# Patient Record
Sex: Female | Born: 1995 | Race: Black or African American | Hispanic: No | Marital: Single | Smoking: Never smoker
Health system: Southern US, Community
[De-identification: ages and names within clinical notes are randomized; demographics above are authoritative.]

## PROBLEM LIST (undated history)

## (undated) DIAGNOSIS — Z789 Other specified health status: Secondary | ICD-10-CM

## (undated) DIAGNOSIS — J45909 Unspecified asthma, uncomplicated: Secondary | ICD-10-CM

## (undated) DIAGNOSIS — Z6841 Body Mass Index (BMI) 40.0 and over, adult: Secondary | ICD-10-CM

## (undated) DIAGNOSIS — A599 Trichomoniasis, unspecified: Secondary | ICD-10-CM

## (undated) HISTORY — DX: Body Mass Index (BMI) 40.0 and over, adult: Z684

## (undated) HISTORY — DX: Trichomoniasis, unspecified: A59.9

---
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
## 2018-11-04 NOTE — L&D Delivery Note (Signed)
Delivery Note At 8:48 AM a viable female was delivered via  (Presentation: Vertex; ROA).  APGAR: 8, 9; weight see delivery note.   Placenta status: Spontaneous, intact. Cord: 3 with the following complications: none.   Anesthesia: None  Episiotomy: None  Lacerations: 1st degree periurethral Suture Repair: None. Hemostatic. Est. Blood Loss (mL):  50  Mom to postpartum.  Baby to Couplet care / Skin to Skin.  Gerlene Fee, DO Family Medicine Resident, PGY-1 05/21/2019, 9:03 AM

## 2019-03-11 ENCOUNTER — Other Ambulatory Visit (HOSPITAL_COMMUNITY)
Admission: RE | Admit: 2019-03-11 | Discharge: 2019-03-11 | Disposition: A | Discharge status: Home / Self Care | Payer: Medicaid Other | Source: Ambulatory Visit | Attending: Obstetrics and Gynecology | Admitting: Obstetrics and Gynecology

## 2019-03-11 ENCOUNTER — Encounter: Payer: Self-pay | Admitting: Obstetrics and Gynecology

## 2019-03-11 ENCOUNTER — Other Ambulatory Visit: Payer: Self-pay

## 2019-03-11 ENCOUNTER — Ambulatory Visit (INDEPENDENT_AMBULATORY_CARE_PROVIDER_SITE_OTHER): Payer: Medicaid Other | Admitting: Obstetrics and Gynecology

## 2019-03-11 VITALS — BP 109/70 | HR 72 | Ht 62.0 in | Wt 339.4 lb

## 2019-03-11 DIAGNOSIS — O99213 Obesity complicating pregnancy, third trimester: Secondary | ICD-10-CM | POA: Diagnosis not present

## 2019-03-11 DIAGNOSIS — O99210 Obesity complicating pregnancy, unspecified trimester: Secondary | ICD-10-CM

## 2019-03-11 DIAGNOSIS — O0993 Supervision of high risk pregnancy, unspecified, third trimester: Secondary | ICD-10-CM

## 2019-03-11 DIAGNOSIS — Z3A28 28 weeks gestation of pregnancy: Secondary | ICD-10-CM

## 2019-03-11 DIAGNOSIS — O34219 Maternal care for unspecified type scar from previous cesarean delivery: Secondary | ICD-10-CM | POA: Diagnosis not present

## 2019-03-11 DIAGNOSIS — O0990 Supervision of high risk pregnancy, unspecified, unspecified trimester: Secondary | ICD-10-CM

## 2019-03-11 DIAGNOSIS — Z98891 History of uterine scar from previous surgery: Secondary | ICD-10-CM

## 2019-03-11 DIAGNOSIS — O0930 Supervision of pregnancy with insufficient antenatal care, unspecified trimester: Secondary | ICD-10-CM | POA: Insufficient documentation

## 2019-03-11 DIAGNOSIS — Z6841 Body Mass Index (BMI) 40.0 and over, adult: Secondary | ICD-10-CM | POA: Insufficient documentation

## 2019-03-11 NOTE — Progress Notes (Signed)
Last pap 2020- normal  Declined tdap Declined flu
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
## 2019-03-12 LAB — OBSTETRIC PANEL, INCLUDING HIV
Basophils Absolute: 0 10*3/uL (ref 0.0–0.2)
Basos: 0 %
EOS (ABSOLUTE): 0 10*3/uL (ref 0.0–0.4)
Eos: 0 %
HIV Screen 4th Generation wRfx: NONREACTIVE
Hematocrit: 29.4 % — ABNORMAL LOW (ref 34.0–46.6)
Hemoglobin: 9.6 g/dL — ABNORMAL LOW (ref 11.1–15.9)
Hepatitis B Surface Ag: NEGATIVE
Immature Grans (Abs): 0.1 10*3/uL (ref 0.0–0.1)
Immature Granulocytes: 1 %
Lymphocytes Absolute: 2.2 10*3/uL (ref 0.7–3.1)
Lymphs: 19 %
MCH: 24.3 pg — ABNORMAL LOW (ref 26.6–33.0)
MCHC: 32.7 g/dL (ref 31.5–35.7)
MCV: 74 fL — ABNORMAL LOW (ref 79–97)
Monocytes Absolute: 0.5 10*3/uL (ref 0.1–0.9)
Monocytes: 4 %
Neutrophils Absolute: 9 10*3/uL — ABNORMAL HIGH (ref 1.4–7.0)
Neutrophils: 76 %
Platelets: 349 10*3/uL (ref 150–450)
RBC: 3.95 x10E6/uL (ref 3.77–5.28)
RDW: 16 % — ABNORMAL HIGH (ref 11.7–15.4)
RPR Ser Ql: NONREACTIVE
Rh Factor: POSITIVE
Rubella Antibodies, IGG: 10.9 index (ref >0.99)
WBC: 11.9 10*3/uL — ABNORMAL HIGH (ref 3.4–10.8)

## 2019-03-12 LAB — DRUG SCREEN 764883 11+OXYCO+ALC+CRT-BUND
Amphetamines, Urine: NEGATIVE ng/mL
BENZODIAZ UR QL: NEGATIVE ng/mL
Barbiturate: NEGATIVE ng/mL
Cannabinoid Quant, Ur: NEGATIVE ng/mL
Cocaine (Metabolite): NEGATIVE ng/mL
Creatinine: 149.1 mg/dL (ref 20.0–300.0)
Ethanol: NEGATIVE %
Meperidine: NEGATIVE ng/mL
Methadone Screen, Urine: NEGATIVE ng/mL
OPIATE SCREEN URINE: NEGATIVE ng/mL
Oxycodone/Oxymorphone, Urine: NEGATIVE ng/mL
Phencyclidine: NEGATIVE ng/mL
Propoxyphene: NEGATIVE ng/mL
Tramadol: NEGATIVE ng/mL
pH, Urine: 6.5 (ref 4.5–8.9)

## 2019-03-12 LAB — PROTEIN / CREATININE RATIO, URINE
Creatinine, Urine: 148.4 mg/dL
Protein, Ur: 14.5 mg/dL
Protein/Creat Ratio: 98 mg/g creat (ref 0–200)

## 2019-03-12 LAB — AB SCR+ANTIBODY ID: Antibody Screen: POSITIVE — AB

## 2019-03-12 LAB — GLUCOSE TOLERANCE, 1 HOUR: Glucose, 1Hr PP: 126 mg/dL (ref 65–199)

## 2019-03-12 LAB — TSH: TSH: 0.544 u[IU]/mL (ref 0.450–4.500)

## 2019-03-13 LAB — URINE CULTURE, OB REFLEX

## 2019-03-13 LAB — CULTURE, OB URINE

## 2019-03-15 ENCOUNTER — Encounter: Payer: Self-pay | Admitting: Obstetrics and Gynecology

## 2019-03-15 DIAGNOSIS — O361990 Maternal care for other isoimmunization, unspecified trimester, not applicable or unspecified: Secondary | ICD-10-CM | POA: Insufficient documentation

## 2019-03-15 DIAGNOSIS — O23599 Infection of other part of genital tract in pregnancy, unspecified trimester: Secondary | ICD-10-CM

## 2019-03-15 DIAGNOSIS — O99019 Anemia complicating pregnancy, unspecified trimester: Secondary | ICD-10-CM | POA: Insufficient documentation

## 2019-03-15 DIAGNOSIS — A5901 Trichomonal vulvovaginitis: Secondary | ICD-10-CM | POA: Insufficient documentation

## 2019-03-15 LAB — CERVICOVAGINAL ANCILLARY ONLY
Chlamydia: NEGATIVE
Neisseria Gonorrhea: NEGATIVE
Trichomonas: POSITIVE — AB

## 2019-03-15 MED ORDER — PREPLUS 27-1 MG PO TABS: 1.0000 | ORAL_TABLET | Freq: Every day | ORAL | 13 refills | Status: AC

## 2019-03-16 ENCOUNTER — Ambulatory Visit (HOSPITAL_COMMUNITY): Payer: Medicaid Other | Admitting: *Deleted

## 2019-03-16 ENCOUNTER — Telehealth: Payer: Self-pay | Admitting: *Deleted

## 2019-03-16 ENCOUNTER — Encounter (HOSPITAL_COMMUNITY): Payer: Self-pay

## 2019-03-16 ENCOUNTER — Other Ambulatory Visit (HOSPITAL_COMMUNITY): Payer: Self-pay | Admitting: *Deleted

## 2019-03-16 ENCOUNTER — Ambulatory Visit (HOSPITAL_COMMUNITY)
Admission: RE | Admit: 2019-03-16 | Discharge: 2019-03-16 | Disposition: A | Discharge status: Home / Self Care | Payer: Medicaid Other | Source: Ambulatory Visit | Attending: Obstetrics and Gynecology | Admitting: Obstetrics and Gynecology

## 2019-03-16 ENCOUNTER — Other Ambulatory Visit: Payer: Self-pay

## 2019-03-16 VITALS — Temp 98.4°F

## 2019-03-16 DIAGNOSIS — O0933 Supervision of pregnancy with insufficient antenatal care, third trimester: Secondary | ICD-10-CM

## 2019-03-16 DIAGNOSIS — O0990 Supervision of high risk pregnancy, unspecified, unspecified trimester: Secondary | ICD-10-CM

## 2019-03-16 DIAGNOSIS — O99213 Obesity complicating pregnancy, third trimester: Secondary | ICD-10-CM

## 2019-03-16 DIAGNOSIS — Z362 Encounter for other antenatal screening follow-up: Secondary | ICD-10-CM

## 2019-03-16 DIAGNOSIS — Z363 Encounter for antenatal screening for malformations: Secondary | ICD-10-CM

## 2019-03-16 DIAGNOSIS — O34219 Maternal care for unspecified type scar from previous cesarean delivery: Secondary | ICD-10-CM | POA: Diagnosis not present

## 2019-03-16 DIAGNOSIS — O99013 Anemia complicating pregnancy, third trimester: Secondary | ICD-10-CM

## 2019-03-16 DIAGNOSIS — Z3A29 29 weeks gestation of pregnancy: Secondary | ICD-10-CM

## 2019-03-16 LAB — COMPREHENSIVE METABOLIC PANEL
ALT: 18 IU/L (ref 0–32)
AST: 12 IU/L (ref 0–40)
Albumin/Globulin Ratio: 1.1 — ABNORMAL LOW (ref 1.2–2.2)
Albumin: 3.3 g/dL — ABNORMAL LOW (ref 3.9–5.0)
Alkaline Phosphatase: 91 IU/L (ref 39–117)
BUN/Creatinine Ratio: 13 (ref 9–23)
BUN: 7 mg/dL (ref 6–20)
Bilirubin Total: <0.2 mg/dL (ref 0.0–1.2)
CO2: 19 mmol/L — ABNORMAL LOW (ref 20–29)
Calcium: 9.2 mg/dL (ref 8.7–10.2)
Chloride: 99 mmol/L (ref 96–106)
Creatinine, Ser: 0.55 mg/dL — ABNORMAL LOW (ref 0.57–1.00)
GFR calc Af Amer: 153 mL/min/{1.73_m2} (ref >59)
GFR calc non Af Amer: 133 mL/min/{1.73_m2} (ref >59)
Globulin, Total: 3 g/dL (ref 1.5–4.5)
Glucose: 143 mg/dL — ABNORMAL HIGH (ref 65–99)
Potassium: 4.1 mmol/L (ref 3.5–5.2)
Sodium: 134 mmol/L (ref 134–144)
Total Protein: 6.3 g/dL (ref 6.0–8.5)

## 2019-03-16 LAB — SPECIMEN STATUS REPORT

## 2019-03-16 MED ORDER — METRONIDAZOLE 500 MG PO TABS: ORAL_TABLET | ORAL | 1 refills | Status: DC

## 2019-03-16 NOTE — Telephone Encounter (Signed)
Pt informed of results and positive for trichomonas. Rx sent into to pharmacy and explained to that the partner also needs to be treated. Instructed to no have intercourse until at least 10 days after treatment. Pt verbalized and understands.

## 2019-03-17 ENCOUNTER — Encounter: Payer: Self-pay | Admitting: Obstetrics and Gynecology

## 2019-03-17 DIAGNOSIS — O0990 Supervision of high risk pregnancy, unspecified, unspecified trimester: Secondary | ICD-10-CM

## 2019-03-22 ENCOUNTER — Telehealth: Payer: Self-pay | Admitting: Radiology

## 2019-03-22 ENCOUNTER — Encounter: Payer: Self-pay | Admitting: Radiology

## 2019-03-22 NOTE — Telephone Encounter (Signed)
Patient was informed of [facility] lab results

## 2019-03-23 ENCOUNTER — Encounter: Payer: Self-pay | Admitting: Obstetrics and Gynecology

## 2019-03-23 ENCOUNTER — Encounter: Payer: Self-pay | Admitting: *Deleted

## 2019-03-23 ENCOUNTER — Other Ambulatory Visit: Payer: Self-pay

## 2019-03-23 ENCOUNTER — Ambulatory Visit (INDEPENDENT_AMBULATORY_CARE_PROVIDER_SITE_OTHER): Payer: Medicaid Other | Admitting: Obstetrics and Gynecology

## 2019-03-23 VITALS — BP 121/76 | HR 86 | Wt 345.0 lb

## 2019-03-23 DIAGNOSIS — A5901 Trichomonal vulvovaginitis: Secondary | ICD-10-CM

## 2019-03-23 DIAGNOSIS — O0990 Supervision of high risk pregnancy, unspecified, unspecified trimester: Secondary | ICD-10-CM

## 2019-03-23 DIAGNOSIS — O23593 Infection of other part of genital tract in pregnancy, third trimester: Secondary | ICD-10-CM

## 2019-03-23 DIAGNOSIS — Z98891 History of uterine scar from previous surgery: Secondary | ICD-10-CM

## 2019-03-23 DIAGNOSIS — D563 Thalassemia minor: Secondary | ICD-10-CM | POA: Insufficient documentation

## 2019-03-23 DIAGNOSIS — Z3A30 30 weeks gestation of pregnancy: Secondary | ICD-10-CM

## 2019-03-23 DIAGNOSIS — O99013 Anemia complicating pregnancy, third trimester: Secondary | ICD-10-CM

## 2019-03-23 DIAGNOSIS — O361930 Maternal care for other isoimmunization, third trimester, not applicable or unspecified: Secondary | ICD-10-CM

## 2019-03-23 DIAGNOSIS — O361910 Maternal care for other isoimmunization, first trimester, not applicable or unspecified: Secondary | ICD-10-CM

## 2019-03-23 MED ORDER — FERROUS GLUCONATE 324 (38 FE) MG PO TABS: 324.0000 mg | ORAL_TABLET | Freq: Every day | ORAL | 2 refills | Status: DC

## 2019-03-23 NOTE — Progress Notes (Signed)
Prenatal Visit Note Date: 03/23/2019 Clinic: [facility]  Subjective:  Kellie Boyd is a 23 y.o. [id number] at [redacted]w[redacted]d being seen today for ongoing prenatal care.  She is currently monitored for the following issues for this high-risk pregnancy and has Late prenatal care; Obesity in pregnancy; Supervision of high risk pregnancy, antepartum; BMI 60.0-69.9, adult (HCC); History of cesarean delivery affecting pregnancy; History of VBAC; Lewis isoimmunization during pregnancy; Anemia in pregnancy; Trichomonal vaginitis during pregnancy; and Alpha thalassemia silent carrier on their problem list.  Patient reports no complaints.   Contractions: Not present. Vag. Bleeding: None.  Movement: Present. Denies leaking of fluid.   The following portions of the patient's history were reviewed and updated as appropriate: allergies, current medications, past family history, past medical history, past social history, past surgical history and problem list. Problem list updated.  Objective:   Vitals:   03/23/19 1025  BP: 121/76  Pulse: 86  Weight: (!) 345 lb (156.5 kg)    Fetal Status: Fetal Heart Rate (bpm): 138   Movement: Present     General:  Alert, oriented and cooperative. Patient is in no acute distress.  Skin: Skin is warm and dry. No rash noted.   Cardiovascular: Normal heart rate noted  Respiratory: Normal respiratory effort, no problems with respiration noted  Abdomen: Soft, gravid, appropriate for gestational age. Pain/Pressure: Present     Pelvic:  Cervical exam deferred        Extremities: Normal range of motion.     Mental Status: Normal mood and affect. Normal behavior. Normal judgment and thought content.   Urinalysis:      Assessment and Plan:  Pregnancy: [id number] at [redacted]w[redacted]d  1. Trichomonal vaginitis during pregnancy in third trimester toc one month  2. Supervision of high risk pregnancy, antepartum btl papers today. D/w her re: btl. D/w her re: weight  3.  Lewis isoimmunization during pregnancy in first trimester, single or unspecified fetus No issues  4. Anemia during pregnancy in third trimester Alpha thal silent carrier (d/w pt)  5. History of VBAC Will set up rpt at edc but it goes into labor before then will try for VD. Form signed today  Preterm labor symptoms and general obstetric precautions including but not limited to vaginal bleeding, contractions, leaking of fluid and fetal movement were reviewed in detail with the patient. Please refer to After Visit Summary for other counseling recommendations.  Return in about 2 weeks (around 04/06/2019) for rob in person.   Thatcher Bing, MD

## 2019-03-31 ENCOUNTER — Encounter: Payer: Self-pay | Admitting: Radiology

## 2019-03-31 ENCOUNTER — Encounter: Payer: Self-pay | Admitting: *Deleted

## 2019-04-06 ENCOUNTER — Ambulatory Visit (INDEPENDENT_AMBULATORY_CARE_PROVIDER_SITE_OTHER): Payer: Medicaid Other | Admitting: Obstetrics and Gynecology

## 2019-04-06 ENCOUNTER — Other Ambulatory Visit: Payer: Self-pay

## 2019-04-06 ENCOUNTER — Other Ambulatory Visit (HOSPITAL_COMMUNITY)
Admission: RE | Admit: 2019-04-06 | Discharge: 2019-04-06 | Disposition: A | Discharge status: Home / Self Care | Payer: Medicaid Other | Source: Ambulatory Visit | Attending: Obstetrics and Gynecology | Admitting: Obstetrics and Gynecology

## 2019-04-06 VITALS — BP 133/75 | HR 72 | Wt 350.0 lb

## 2019-04-06 DIAGNOSIS — Z98891 History of uterine scar from previous surgery: Secondary | ICD-10-CM

## 2019-04-06 DIAGNOSIS — Z6841 Body Mass Index (BMI) 40.0 and over, adult: Secondary | ICD-10-CM

## 2019-04-06 DIAGNOSIS — O23593 Infection of other part of genital tract in pregnancy, third trimester: Secondary | ICD-10-CM

## 2019-04-06 DIAGNOSIS — A5901 Trichomonal vulvovaginitis: Secondary | ICD-10-CM | POA: Diagnosis present

## 2019-04-06 DIAGNOSIS — Z3A32 32 weeks gestation of pregnancy: Secondary | ICD-10-CM

## 2019-04-06 MED ORDER — PANTOPRAZOLE SODIUM 20 MG PO TBEC: 20.0000 mg | DELAYED_RELEASE_TABLET | Freq: Every day | ORAL | 3 refills | Status: DC

## 2019-04-06 NOTE — Progress Notes (Signed)
Need something for heart burn  TOC

## 2019-04-06 NOTE — Progress Notes (Signed)
Prenatal Visit Note Date: 04/06/2019 [facility]  Subjective:  Kellie Boyd is a 23 y.o. [id number] at [redacted]w[redacted]d being seen today for ongoing prenatal care.  She is currently monitored for the following issues for this high-risk pregnancy and has Late prenatal care; Obesity in pregnancy; Supervision of high risk pregnancy, antepartum; BMI 60.0-69.9, adult (HCC); History of cesarean delivery affecting pregnancy; History of VBAC; Lewis isoimmunization during pregnancy; Anemia in pregnancy; Trichomonal vaginitis during pregnancy; and Alpha thalassemia silent carrier on their problem list.  Patient reports no complaints.   Contractions: Not present. Vag. Bleeding: None.  Movement: Present. Denies leaking of fluid.   The following portions of the patient's history were reviewed and updated as appropriate: allergies, current medications, past family history, past medical history, past social history, past surgical history and problem list. Problem list updated.  Objective:   Vitals:   04/06/19 1018  BP: 133/75  Pulse: 72  Weight: (!) 350 lb (158.8 kg)    Fetal Status: Fetal Heart Rate (bpm): 134 Fundal Height: 33 cm Movement: Present     General:  Alert, oriented and cooperative. Patient is in no acute distress.  Skin: Skin is warm and dry. No rash noted.   Cardiovascular: Normal heart rate noted  Respiratory: Normal respiratory effort, no problems with respiration noted  Abdomen: Soft, gravid, appropriate for gestational age. Pain/Pressure: Present     Pelvic:  Cervical exam deferred        Extremities: Normal range of motion.     Mental Status: Normal mood and affect. Normal behavior. Normal judgment and thought content.   Urinalysis:      Assessment and Plan:  Pregnancy: [id number] at [redacted]w[redacted]d  1. Trichomonal vaginitis during pregnancy in third trimester toc today - Cervicovaginal ancillary only( [place])  2. History of VBAC C/s set up if doesn't go into  labor. tolac consent signed  3. BMI 60.0-69.9, adult (HCC)  Preterm labor symptoms and general obstetric precautions including but not limited to vaginal bleeding, contractions, leaking of fluid and fetal movement were reviewed in detail with the patient. Please refer to After Visit Summary for other counseling recommendations.  Return in about 2 weeks (around 04/20/2019) for rob in person.    Bing, MD

## 2019-04-07 LAB — CERVICOVAGINAL ANCILLARY ONLY
Chlamydia: NEGATIVE
Neisseria Gonorrhea: NEGATIVE
Trichomonas: POSITIVE — AB

## 2019-04-09 MED ORDER — METRONIDAZOLE 500 MG PO TABS: 500.0000 mg | ORAL_TABLET | Freq: Two times a day (BID) | ORAL | 0 refills | Status: DC

## 2019-04-09 NOTE — Addendum Note (Signed)
Addended by: Sublette Bing on: 04/09/2019 02:04 PM   Modules accepted: Orders

## 2019-04-12 ENCOUNTER — Other Ambulatory Visit: Payer: Self-pay | Admitting: *Deleted

## 2019-04-12 MED ORDER — METRONIDAZOLE 500 MG PO TABS: 500.0000 mg | ORAL_TABLET | Freq: Two times a day (BID) | ORAL | 0 refills | Status: AC

## 2019-04-15 ENCOUNTER — Ambulatory Visit (HOSPITAL_COMMUNITY): Payer: Medicaid Other | Admitting: *Deleted

## 2019-04-15 ENCOUNTER — Other Ambulatory Visit: Payer: Self-pay

## 2019-04-15 ENCOUNTER — Encounter (HOSPITAL_COMMUNITY): Payer: Self-pay

## 2019-04-15 ENCOUNTER — Ambulatory Visit (HOSPITAL_COMMUNITY)
Admission: RE | Admit: 2019-04-15 | Discharge: 2019-04-15 | Disposition: A | Discharge status: Home / Self Care | Payer: Medicaid Other | Source: Ambulatory Visit | Attending: Obstetrics and Gynecology | Admitting: Obstetrics and Gynecology

## 2019-04-15 ENCOUNTER — Other Ambulatory Visit (HOSPITAL_COMMUNITY): Payer: Self-pay | Admitting: *Deleted

## 2019-04-15 VITALS — BP 114/61 | HR 93 | Temp 98.6°F

## 2019-04-15 DIAGNOSIS — O34219 Maternal care for unspecified type scar from previous cesarean delivery: Secondary | ICD-10-CM

## 2019-04-15 DIAGNOSIS — O99013 Anemia complicating pregnancy, third trimester: Secondary | ICD-10-CM

## 2019-04-15 DIAGNOSIS — Z362 Encounter for other antenatal screening follow-up: Secondary | ICD-10-CM | POA: Insufficient documentation

## 2019-04-15 DIAGNOSIS — O99213 Obesity complicating pregnancy, third trimester: Secondary | ICD-10-CM

## 2019-04-15 DIAGNOSIS — O0990 Supervision of high risk pregnancy, unspecified, unspecified trimester: Secondary | ICD-10-CM | POA: Diagnosis present

## 2019-04-15 DIAGNOSIS — Z3689 Encounter for other specified antenatal screening: Secondary | ICD-10-CM

## 2019-04-15 DIAGNOSIS — O0933 Supervision of pregnancy with insufficient antenatal care, third trimester: Secondary | ICD-10-CM

## 2019-04-15 DIAGNOSIS — Z3A33 33 weeks gestation of pregnancy: Secondary | ICD-10-CM

## 2019-04-20 ENCOUNTER — Other Ambulatory Visit: Payer: Self-pay

## 2019-04-20 ENCOUNTER — Ambulatory Visit (INDEPENDENT_AMBULATORY_CARE_PROVIDER_SITE_OTHER): Payer: Medicaid Other | Admitting: Family Medicine

## 2019-04-20 VITALS — BP 127/74 | HR 80 | Wt 347.0 lb

## 2019-04-20 DIAGNOSIS — O23593 Infection of other part of genital tract in pregnancy, third trimester: Secondary | ICD-10-CM

## 2019-04-20 DIAGNOSIS — A5901 Trichomonal vulvovaginitis: Secondary | ICD-10-CM

## 2019-04-20 DIAGNOSIS — O0990 Supervision of high risk pregnancy, unspecified, unspecified trimester: Secondary | ICD-10-CM

## 2019-04-20 DIAGNOSIS — Z3A34 34 weeks gestation of pregnancy: Secondary | ICD-10-CM

## 2019-04-20 DIAGNOSIS — Z98891 History of uterine scar from previous surgery: Secondary | ICD-10-CM

## 2019-04-20 MED ORDER — TINIDAZOLE 500 MG PO TABS: 2.0000 g | ORAL_TABLET | Freq: Once | ORAL | 1 refills | Status: AC

## 2019-04-20 NOTE — Progress Notes (Signed)
   PRENATAL VISIT NOTE  Subjective:  Kellie Boyd is a 23 y.o. [phone] at [redacted]w[redacted]d being seen today for ongoing prenatal care.  She is currently monitored for the following issues for this high-risk pregnancy and has Late prenatal care; Obesity in pregnancy; Supervision of high risk pregnancy, antepartum; BMI 60.0-69.9, adult ([place]); History of cesarean delivery affecting pregnancy; History of VBAC; Lewis isoimmunization during pregnancy; Anemia in pregnancy; Trichomonal vaginitis during pregnancy; and Alpha thalassemia silent carrier on their problem list.  Patient reports no complaints.  Contractions: Not present. Vag. Bleeding: None.  Movement: Present. Denies leaking of fluid.   The following portions of the patient's history were reviewed and updated as appropriate: allergies, current medications, past family history, past medical history, past social history, past surgical history and problem list.   Objective:   Vitals:   04/20/19 1125  BP: 127/74  Pulse: 80  Weight: (!) 347 lb (157.4 kg)    Fetal Status: Fetal Heart Rate (bpm): 138   Movement: Present     General:  Alert, oriented and cooperative. Patient is in no acute distress.  Skin: Skin is warm and dry. No rash noted.   Cardiovascular: Normal heart rate noted  Respiratory: Normal respiratory effort, no problems with respiration noted  Abdomen: Soft, gravid, appropriate for gestational age.  Pain/Pressure: Absent     Pelvic: Cervical exam deferred        Extremities: Normal range of motion.  Edema: None  Mental Status: Normal mood and affect. Normal behavior. Normal judgment and thought content.   Assessment and Plan:  Pregnancy: [id number] at [redacted]w[redacted]d 1. Trichomonal vaginitis during pregnancy in third trimester Re-treat with Tindamax  2. Supervision of high risk pregnancy, antepartum For 36 wk cultures at next visit  3. History of VBAC For VBAC is labors, o/w RCS--desires BTL and papers are signed, and she might want them  untied in 5 years. Discussed not doing BTL if unsure about future fertility.  Preterm labor symptoms and general obstetric precautions including but not limited to vaginal bleeding, contractions, leaking of fluid and fetal movement were reviewed in detail with the patient. Please refer to After Visit Summary for other counseling recommendations.   Return in 2 weeks (on 05/04/2019).  Future Appointments  Date Time Provider [place]  05/04/2019  1:45 PM Donnamae Jude, MD [facility]  05/13/2019  9:30 AM [facility] NURSE [place] [facility]  05/13/2019  9:30 AM [facility] [place] 1 [facility]    Donnamae Jude, MD

## 2019-04-20 NOTE — Progress Notes (Signed)
Needs RX for flagyl for trich

## 2019-04-20 NOTE — Patient Instructions (Signed)

## 2019-05-04 ENCOUNTER — Ambulatory Visit (INDEPENDENT_AMBULATORY_CARE_PROVIDER_SITE_OTHER): Payer: Medicaid Other | Admitting: Family Medicine

## 2019-05-04 ENCOUNTER — Other Ambulatory Visit (HOSPITAL_COMMUNITY)
Admission: RE | Admit: 2019-05-04 | Discharge: 2019-05-04 | Disposition: A | Discharge status: Home / Self Care | Payer: Medicaid Other | Source: Ambulatory Visit | Attending: Family Medicine | Admitting: Family Medicine

## 2019-05-04 ENCOUNTER — Other Ambulatory Visit: Payer: Self-pay

## 2019-05-04 VITALS — BP 132/76 | HR 93 | Wt 350.0 lb

## 2019-05-04 DIAGNOSIS — O0990 Supervision of high risk pregnancy, unspecified, unspecified trimester: Secondary | ICD-10-CM | POA: Diagnosis not present

## 2019-05-04 DIAGNOSIS — O34219 Maternal care for unspecified type scar from previous cesarean delivery: Secondary | ICD-10-CM

## 2019-05-04 DIAGNOSIS — Z98891 History of uterine scar from previous surgery: Secondary | ICD-10-CM

## 2019-05-04 DIAGNOSIS — Z3A36 36 weeks gestation of pregnancy: Secondary | ICD-10-CM

## 2019-05-04 DIAGNOSIS — O0993 Supervision of high risk pregnancy, unspecified, third trimester: Secondary | ICD-10-CM

## 2019-05-04 NOTE — Progress Notes (Signed)
   PRENATAL VISIT NOTE  Subjective:  Kellie Boyd is a 23 y.o. [phone] at [redacted]w[redacted]d being seen today for ongoing prenatal care.  She is currently monitored for the following issues for this low-risk pregnancy and has Late prenatal care; Obesity in pregnancy; Supervision of high risk pregnancy, antepartum; BMI 60.0-69.9, adult ([place]); History of cesarean delivery affecting pregnancy; History of VBAC; Lewis isoimmunization during pregnancy; Anemia in pregnancy; Trichomonal vaginitis during pregnancy; and Alpha thalassemia silent carrier on their problem list.  Patient reports no complaints.  Contractions: Irritability. Vag. Bleeding: None.  Movement: Present. Denies leaking of fluid.   The following portions of the patient's history were reviewed and updated as appropriate: allergies, current medications, past family history, past medical history, past social history, past surgical history and problem list.   Objective:   Vitals:   05/04/19 1341  BP: 132/76  Pulse: 93  Weight: (!) 350 lb (158.8 kg)    Fetal Status: Fetal Heart Rate (bpm): 150 Fundal Height: 44 cm Movement: Present  Presentation: Vertex  General:  Alert, oriented and cooperative. Patient is in no acute distress.  Skin: Skin is warm and dry. No rash noted.   Cardiovascular: Normal heart rate noted  Respiratory: Normal respiratory effort, no problems with respiration noted  Abdomen: Soft, gravid, appropriate for gestational age.  Pain/Pressure: Present     Pelvic: Cervical exam performed Dilation: 1 Effacement (%): 40 Station: -2  Extremities: Normal range of motion.  Edema: None  Mental Status: Normal mood and affect. Normal behavior. Normal judgment and thought content.   Assessment and Plan:  Pregnancy: [id number] at [redacted]w[redacted]d 1. Supervision of high risk pregnancy, antepartum Cultures today - Strep Gp B NAA - GC/Chlamydia probe amp ([place])not at [facility]  2. History of cesarean delivery affecting pregnancy For RCS if does  not go into labor  3. History of VBAC For VBAC if labors  Preterm labor symptoms and general obstetric precautions including but not limited to vaginal bleeding, contractions, leaking of fluid and fetal movement were reviewed in detail with the patient. Please refer to After Visit Summary for other counseling recommendations.   Return in 1 week (on 05/11/2019) for in person.  Future Appointments  Date Time Provider [place]  05/11/2019 10:45 AM Osborne Oman, MD [facility]  05/13/2019  9:30 AM [facility] NURSE [place] [facility]  05/13/2019  9:30 AM [facility] [place] 1 [facility]    Donnamae Jude, MD

## 2019-05-04 NOTE — Patient Instructions (Addendum)
Breastfeeding  Choosing to breastfeed is one of the best decisions you can make for yourself and your baby. A change in hormones during pregnancy causes your breasts to make breast milk in your milk-producing glands. Hormones prevent breast milk from being released before your baby is born. They also prompt milk flow after birth. Once breastfeeding has begun, thoughts of your baby, as well as his or her sucking or crying, can stimulate the release of milk from your milk-producing glands. Benefits of breastfeeding Research shows that breastfeeding offers many health benefits for infants and mothers. It also offers a cost-free and convenient way to feed your baby. For your baby  Your first milk (colostrum) helps your baby's digestive system to function better.  Special cells in your milk (antibodies) help your baby to fight off infections.  Breastfed babies are less likely to develop asthma, allergies, obesity, or type 2 diabetes. They are also at lower risk for sudden infant death syndrome (SIDS).  Nutrients in breast milk are better able to meet your babys needs compared to infant formula.  Breast milk improves your baby's brain development. For you  Breastfeeding helps to create a very special bond between you and your baby.  Breastfeeding is convenient. Breast milk costs nothing and is always available at the correct temperature.  Breastfeeding helps to burn calories. It helps you to lose the weight that you gained during pregnancy.  Breastfeeding makes your uterus return faster to its size before pregnancy. It also slows bleeding (lochia) after you give birth.  Breastfeeding helps to lower your risk of developing type 2 diabetes, osteoporosis, rheumatoid arthritis, cardiovascular disease, and breast, ovarian, uterine, and endometrial cancer later in life. Breastfeeding basics Starting breastfeeding  Find a comfortable place to sit or lie down, with your neck and back  well-supported.  Place a pillow or a rolled-up blanket under your baby to bring him or her to the level of your breast (if you are seated). Nursing pillows are specially designed to help support your arms and your baby while you breastfeed.  Make sure that your baby's tummy (abdomen) is facing your abdomen.  Gently massage your breast. With your fingertips, massage from the outer edges of your breast inward toward the nipple. This encourages milk flow. If your milk flows slowly, you may need to continue this action during the feeding.  Support your breast with 4 fingers underneath and your thumb above your nipple (make the letter "C" with your hand). Make sure your fingers are well away from your nipple and your babys mouth.  Stroke your baby's lips gently with your finger or nipple.  When your baby's mouth is open wide enough, quickly bring your baby to your breast, placing your entire nipple and as much of the areola as possible into your baby's mouth. The areola is the colored area around your nipple. ? More areola should be visible above your baby's upper lip than below the lower lip. ? Your baby's lips should be opened and extended outward (flanged) to ensure an adequate, comfortable latch. ? Your baby's tongue should be between his or her lower gum and your breast.  Make sure that your baby's mouth is correctly positioned around your nipple (latched). Your baby's lips should create a seal on your breast and be turned out (everted).  It is common for your baby to suck about 2-3 minutes in order to start the flow of breast milk. Latching Teaching your baby how to latch onto your breast  properly is very important. An improper latch can cause nipple pain, decreased milk supply, and poor weight gain in your baby. Also, if your baby is not latched onto your nipple properly, he or she may swallow some air during feeding. This can make your baby fussy. Burping your baby when you switch breasts  during the feeding can help to get rid of the air. However, teaching your baby to latch on properly is still the best way to prevent fussiness from swallowing air while breastfeeding. Signs that your baby has successfully latched onto your nipple  Silent tugging or silent sucking, without causing you pain. Infant's lips should be extended outward (flanged).  Swallowing heard between every 3-4 sucks once your milk has started to flow (after your let-down milk reflex occurs).  Muscle movement above and in front of his or her ears while sucking. Signs that your baby has not successfully latched onto your nipple  Sucking sounds or smacking sounds from your baby while breastfeeding.  Nipple pain. If you think your baby has not latched on correctly, slip your finger into the corner of your babys mouth to break the suction and place it between your baby's gums. Attempt to start breastfeeding again. Signs of successful breastfeeding Signs from your baby  Your baby will gradually decrease the number of sucks or will completely stop sucking.  Your baby will fall asleep.  Your baby's body will relax.  Your baby will retain a small amount of milk in his or her mouth.  Your baby will let go of your breast by himself or herself. Signs from you  Breasts that have increased in firmness, weight, and size 1-3 hours after feeding.  Breasts that are softer immediately after breastfeeding.  Increased milk volume, as well as a change in milk consistency and color by the fifth day of breastfeeding.  Nipples that are not sore, cracked, or bleeding. Signs that your baby is getting enough milk  Wetting at least 1-2 diapers during the first 24 hours after birth.  Wetting at least 5-6 diapers every 24 hours for the first week after birth. The urine should be clear or pale yellow by the age of 5 days.  Wetting 6-8 diapers every 24 hours as your baby continues to grow and develop.  At least 3 stools in  a 24-hour period by the age of 5 days. The stool should be soft and yellow.  At least 3 stools in a 24-hour period by the age of 7 days. The stool should be seedy and yellow.  No loss of weight greater than 10% of birth weight during the first 3 days of life.  Average weight gain of 4-7 oz (113-198 g) per week after the age of 4 days.  Consistent daily weight gain by the age of 5 days, without weight loss after the age of 2 weeks. After a feeding, your baby may spit up a small amount of milk. This is normal. Breastfeeding frequency and duration Frequent feeding will help you make more milk and can prevent sore nipples and extremely full breasts (breast engorgement). Breastfeed when you feel the need to reduce the fullness of your breasts or when your baby shows signs of hunger. This is called "breastfeeding on demand." Signs that your baby is hungry include:  Increased alertness, activity, or restlessness.  Movement of the head from side to side.  Opening of the mouth when the corner of the mouth or cheek is stroked (rooting).  Increased sucking sounds, smacking  lips, cooing, sighing, or squeaking.  Hand-to-mouth movements and sucking on fingers or hands.  Fussing or crying. Avoid introducing a pacifier to your baby in the first 4-6 weeks after your baby is born. After this time, you may choose to use a pacifier. Research has shown that pacifier use during the first year of a baby's life decreases the risk of sudden infant death syndrome (SIDS). Allow your baby to feed on each breast as long as he or she wants. When your baby unlatches or falls asleep while feeding from the first breast, offer the second breast. Because newborns are often sleepy in the first few weeks of life, you may need to awaken your baby to get him or her to feed. Breastfeeding times will vary from baby to baby. However, the following rules can serve as a guide to help you make sure that your baby is properly  fed:  Newborns (babies 74 weeks of age or younger) may breastfeed every 1-3 hours.  Newborns should not go without breastfeeding for longer than 3 hours during the day or 5 hours during the night.  You should breastfeed your baby a minimum of 8 times in a 24-hour period. Breast milk pumping     Pumping and storing breast milk allows you to make sure that your baby is exclusively fed your breast milk, even at times when you are unable to breastfeed. This is especially important if you go back to work while you are still breastfeeding, or if you are not able to be present during feedings. Your lactation consultant can help you find a method of pumping that works best for you and give you guidelines about how long it is safe to store breast milk. Caring for your breasts while you breastfeed Nipples can become dry, cracked, and sore while breastfeeding. The following recommendations can help keep your breasts moisturized and healthy:  Avoid using soap on your nipples.  Wear a supportive bra designed especially for nursing. Avoid wearing underwire-style bras or extremely tight bras (sports bras).  Air-dry your nipples for 3-4 minutes after each feeding.  Use only cotton bra pads to absorb leaked breast milk. Leaking of breast milk between feedings is normal.  Use lanolin on your nipples after breastfeeding. Lanolin helps to maintain your skin's normal moisture barrier. Pure lanolin is not harmful (not toxic) to your baby. You may also hand express a few drops of breast milk and gently massage that milk into your nipples and allow the milk to air-dry. In the first few weeks after giving birth, some women experience breast engorgement. Engorgement can make your breasts feel heavy, warm, and tender to the touch. Engorgement peaks within 3-5 days after you give birth. The following recommendations can help to ease engorgement:  Completely empty your breasts while breastfeeding or pumping. You may  want to start by applying warm, moist heat (in the shower or with warm, water-soaked hand towels) just before feeding or pumping. This increases circulation and helps the milk flow. If your baby does not completely empty your breasts while breastfeeding, pump any extra milk after he or she is finished.  Apply ice packs to your breasts immediately after breastfeeding or pumping, unless this is too uncomfortable for you. To do this: ? Put ice in a plastic bag. ? Place a towel between your skin and the bag. ? Leave the ice on for 20 minutes, 2-3 times a day.  Make sure that your baby is latched on and positioned properly while  If engorgement persists after 48 hours of following these recommendations, contact your health care provider or a Advertising copywriter. Overall health care recommendations while breastfeeding  Eat 3 healthy meals and 3 snacks every day. Well-nourished mothers who are breastfeeding need an additional 450-500 calories a day. You can meet this requirement by increasing the amount of a balanced diet that you eat.  Drink enough water to keep your urine pale yellow or clear.  Rest often, relax, and continue to take your prenatal vitamins to prevent fatigue, stress, and low vitamin and mineral levels in your body (nutrient deficiencies).  Do not use any products that contain nicotine or tobacco, such as cigarettes and e-cigarettes. Your baby may be harmed by chemicals from cigarettes that pass into breast milk and exposure to secondhand smoke. If you need help quitting, ask your health care provider.  Avoid alcohol.  Do not use illegal drugs or marijuana.  Talk with your health care provider before taking any medicines. These include over-the-counter and prescription medicines as well as vitamins and herbal supplements. Some medicines that may be harmful to your baby can pass through breast milk.  It is possible to become pregnant while breastfeeding. If birth  control is desired, ask your health care provider about options that will be safe while breastfeeding your baby. Where to find more information: [employer]: www.llli.org Contact a health care provider if:  You feel like you want to stop breastfeeding or have become frustrated with breastfeeding.  Your nipples are cracked or bleeding.  Your breasts are red, tender, or warm.  You have: ? Painful breasts or nipples. ? A swollen area on either breast. ? A fever or chills. ? Nausea or vomiting. ? Drainage other than breast milk from your nipples.  Your breasts do not become full before feedings by the fifth day after you give birth.  You feel sad and depressed.  Your baby is: ? Too sleepy to eat well. ? Having trouble sleeping. ? More than 65 week old and wetting fewer than 6 diapers in a 24-hour period. ? Not gaining weight by 28 days of age.  Your baby has fewer than 3 stools in a 24-hour period.  Your baby's skin or the white parts of his or her eyes become yellow. Get help right away if:  Your baby is overly tired (lethargic) and does not want to wake up and feed.  Your baby develops an unexplained fever. Summary  Breastfeeding offers many health benefits for infant and mothers.  Try to breastfeed your infant when he or she shows early signs of hunger.  Gently tickle or stroke your baby's lips with your finger or nipple to allow the baby to open his or her mouth. Bring the baby to your breast. Make sure that much of the areola is in your baby's mouth. Offer one side and burp the baby before you offer the other side.  Talk with your health care provider or lactation consultant if you have questions or you face problems as you breastfeed. This information is not intended to replace advice given to you by your health care provider. Make sure you discuss any questions you have with your health care provider. Document Released: 10/21/2005 Document Revised:  01/15/2018 Document Reviewed: 11/22/2016 Elsevier Patient Education  2020 [employer].  Places to have your son circumcised:  [facility]     [phone]   $480 while you are in hospital         [facility]              [phone]   $269 by 4 wks                      [facility]                     [phone]   $269 by 7 days [facility]                    [phone]   $269 by 4 wks [facility]             [phone]   $175 by 2 wks    These prices sometimes change but are roughly what you can expect to pay. Please call and confirm pricing.   Circumcision is considered an elective/non-medically necessary procedure. There are many reasons parents decide to have their sons circumsized. During the first year of life circumcised males have a reduced risk of urinary tract infections but after this year the rates between circumcised males and uncircumcised males are the same.  It is safe to have your son circumcised outside of the hospital and the places above perform them regularly.    AREA PEDIATRIC/FAMILY PRACTICE PHYSICIANS  [place] ([phone])  [facility] Melodie Bouillono Chambliss, MD; Lum BabeEniola, MD; [place] SliderHale, MD; Leveda AnnaHensel, MD; McDiarmid, MD; Jerene BearsMcIntyer, MD; Jennette KettleNeal, MD; Gwendolyn GrantWalden, MD o [address] [place], [place] [address] o [phone] o Mon-Fri 8:30-12:30, 1:30-5:00 o Providers come to see babies at [facility] o Accepting [facility]  [facility] [place] o Limited providers who accept newborns: Docia ChuckKoirala, MD; Kateri PlummerMorrow, MD; Paulino RilyWolters, MD o [address], [place], [place] [address] o [phone] o Mon-Fri 8:00-5:30 o Babies seen by providers at [facility] o Does NOT accept Medicaid o Please call early in hospitalization for appointment (limited availability)   [facility] Fatima Sangero Mulberry, MD o [address] [place], [place] [address] o [phone] o Mon, Tue,  Thur, Fri 8:30-5:00, Wed 10:00-7:00 (closed 1-2pm) o Babies seen by [facility] providers o Accepting Medicaid  Donnie Coffinubin - Pediatrician Fae Pippino Rubin, MD o [address], [place], [place] [address] o [phone] o Mon-Fri 8:30-5:00, Sat 8:30-12:00 o Provider comes to see babies at [facility] o Accepting Medicaid o Must have been referred from current patients or contacted office prior to delivery  [facility] Leotis Paino Brown, MD; Ave Filterhandler, MD; Luna FuseEttefagh, MD; Kennedy BuckerGrant, MD; Konrad DoloresLester, MD; Kathlene NovemberMcCormick, MD; Jenne CampusMcQueen, MD; Lubertha SouthProse, MD; Wynetta EmerySimha, MD; Duffy RhodyStanley, MD; Gerre CouchStryffeler, NP; Shirl Harrisebben, NP o [address], [place], [place] [address] o [phone] o Mon, Tue, Thur, Fri 8:30-5:30, Wed 9:30-5:30, Sat 8:30-12:30 o Babies seen by [facility] providers o Accepting Medicaid o Only accepting infants of first-time parents or siblings of current patients o Hospital discharge coordinator will make follow-up appointment  Cyril MourningJack Amos o [address], [place], [place]  [address] o [phone]   Fax - [phone]  [facility] o [address], [place], [place]  [address] o Phone - [phone]   Fax - [phone]  Lucio EdwardShilpa Gosrani o [address], [place], [place]  [address] o [phone]  [place] [phone]  [facility] o [place],  MD; Jacklynn Ganong, MD; Torrie Mayers, MD; MD; Rosana Hoes, MD; Servando [place], MD; Rose Fillers, MD; Rex Kras, MD; Corinna Capra, MD; Volney American, MD; Trilby Drummer, MD; Janann Colonel, MD; Jimmye Norman, [place], [place], [place] [address] o [phone] o Mon-Fri 8:30-5:00 (extended evenings Mon-Thur as needed), Sat-Sun 10:00-1:00 o Providers come to see babies at [place] Medicaid for families of first-time babies and families with all children in the household age 75 and under. Must register with office prior to making appointment (M-F only).  [place], NP; Tomi Bamberger, MD; [place] School, MD; [place], [address] [place], [place] [address] o [phone] o Mon-Fri 8:00-5:00 o Babies seen by providers at [facility] o Does NOT accept Medicaid/Commercial Insurance Only  [facility] [place] ([facility])  Marnee Guarneri, MD; Drema Dallas, MD; Montine Circle, MD; Vilma Prader, MD; Vanita Panda, MD; Alfonso Ramus, MD; Ruthann Cancer, MD; Roxanne Mins, MD; Rosalva Ferron, MD; Polly Cobia, MD o [address] [place], [place] [address] o [phone] o Mon-Fri 8:30-5:30, Sat (Oct.-Mar.) 9:00-1:00 o Babies seen by providers at [place] [phone])  [facility] Peers, MD; Suzan Slick, MD o [address], [place], [place] [address] o [phone] o Mon-Fri 8:30-5:00, Sat 8:30-12:00 o Providers come to see babies at [facility] o Does NOT accept Medicaid  [place] [facility] [place], [place]; Mannie Stabile, MD; [place], [place]; Nancy Fetter, MD; Moreen Fowler, [place], [place], [place] [address] o [phone] o Mon-Fri 8:00-5:00 o Babies seen by providers at [facility] o Does NOT accept Medicaid o Only accepting babies of parents who are patients o Please call early in hospitalization for appointment (limited availability)  [facility] Blanca Friend, MD; Sharlene Motts, MD; Rod Can, MD; Warner Mccreedy, NP; Sabra Heck, MD; Ermalinda Memos, MD; Sharlett Iles, NP; Aurther Loft, MD; Jerrye Beavers, MD; Marcello Moores, MD; Berline Lopes, MD; Charolette Forward, MD o [address] [place], [place], [place] [address] o [phone] o Mon-Fri 8:00-5:00, Sat 9:00-12:00 o Providers come to see babies at [facility] o Does NOT accept [facility] [phone])  [place] [facility] providers accepting new patients: Dayna Ramus, NP; Rolland Porter, [place], [place], [place] [address] o [phone] o Mon-Fri 8:00-5:00 o Babies seen by providers at  [facility] o Does NOT accept Medicaid o Only accepting babies of parents who are patients o Please call early in hospitalization for appointment (limited availability)  [facility] Oswaldo Conroy, MD; Sheran Lawless, [address] [place], [place] [address] o [phone] (press [phone] to schedule appointment) o Mon-Fri 8:00-5:00 o Providers come to see babies at [facility] o Does NOT accept [facility] Jodi Mourning, MD o [address] [place], [place] [address] o [phone] o Mon-Fri 8:30-5:00 (lunch 12:30-1:00), extended hours by appointment only Wed 5:00-6:30 o Babies seen by [place] Medicaid  [place] [facility] Evalyn Casco, MD; [place], MD; Ethlyn Gallery, MD o [place], [place], [place] [address] o [phone] o Mon-Fri 8:00-5:00 o Babies seen by [facility] providers o Does NOT accept Medicaid  [place] [facility] [place], MD; Yong Channel, MD; [place], [place] [address] [place],  [address] o [phone] o Mon-Fri 8:00-5:00 o Babies seen by [facility] providers o Does NOT accept [facility]  [place], [place]; [place], [place]; [place], NP; Albertina Parr, MD; Frederic Jericho, MD; Ronney Lion, MD; Carlos Levering, NP; Jerelene Redden, NP; Tomasita Crumble, NP; Ronelle Nigh, NP; Corinna Lines, MD; [place],  MD o [address], [place], [place] [address] o [phone] o Mon-Fri 8:30-5:00, Sat 10:00-1:00 o Providers come to see babies at [facility] o Does NOT accept Medicaid o Free prenatal information session Tuesdays at 4:45pm  [facility] Luna Kitchens, MD; [place], [place]; [place], [place]; Valarie Cones, [place] o [address] [place] [address] o [phone] o Mon-Fri 7:30-5:30 o Babies seen by [facility] providers  [facility] Doctor o [address], [place], [place]  [address] o [phone]   Fax - [phone]  [place] [phone] &  [phone])  [facility] Alphonsa Overall, MD o [address], [place], [place] [address] o [phone] o Mon-Thur 8:00-6:00 o Providers come to see babies at [facility] o Accepting Medicaid  [facility] Zenon Mayo, NP; Cyndia Bent, MD; [place], [place]; [place], [place] o [address] [place], [place] [address] o [phone] o Mon-Thur 7:30-7:30, Fri 7:30-4:30 o Babies seen by [facility] providers o Accepting [facility] Cheryle Horsfall, MD; Janene Harvey, NP; Vonita Moss, MD o [address], [place], [place] [address] o [phone] o Mon-Fri 8:30-5:00, Sat 8:30-12:00 o Providers come to see babies at [facility] o Accepting Medicaid o Must have Meet & Greet appointment at office prior to delivery  [facility] [place] [facility] Llana Aliment, MD; Earlene Plater, MD; Lucretia Roers, MD o [address], [place], [place] [address] o [phone] o Mon-Wed 8:00-6:00, Thur-Fri 8:00-5:00, Sat 9:00-12:00 o Providers come to see babies at [facility] o Does NOT accept Medicaid o Only accepting siblings of current patients  [facility] [place]  o [address], [place], [place]  [address] o [phone]   Fax - [phone]  [facility] o [id number] [address], [place], [place]  [address] o [phone]   Fax - [phone]  [place] Seth Ward [phone] & [phone])  Nature conservation officer at [facility] o [place], [place]; [place], DO o [address] [place], [place] [address] o [phone] o Mon-Fri 7:00-5:00 o Babies seen by [facility] providers o Does NOT accept Medicaid  [facility] Ellis Savage, MD; [place], [place]; [place], [place] o [address], [place], [place] [address] o [phone] o Mon-Fri 8:00-5:00 o Babies seen by [facility] providers o Accepting  Medicaid  [facility] - [address] Franne Forts, MD; [place], [place]; [place], NP; [place], [place] o [address], [place], [place] [address] o [phone] o Mon-Fri 8:00-5:00 o Babies seen by providers at [facility] o Accepting [facility] [place] [phone])  [facility] at [facility] [place], [place] o [address] [place], [place] [address] o [phone] o Mon-Fri 8:00-5:00 o Babies seen by [facility] providers o Does NOT accept Medicaid o Limited availability, please call early in hospitalization to schedule follow-up  [facility] Jolee Ewing, [place]; Eddie Candle, MD; Normand Sloop, MD; [place], [place]; Constance Goltz, MD; [place], [place] o [address], [place], [place] [address] o [phone] o Mon-Fri 8:30-5:00, Sat 9:00-12:00 o Babies seen by providers at [facility] o Accepting Medicaid o Please register online then schedule online or call office o [email or web address]  [facility] - [facility] at [facility]) Samuella Bruin, NP; Lucianne Muss, MD; Lanier Clam, PA o [address], [place], [place] [address] o [phone] o Mon-Fri 8:00-5:00 o Babies  seen by providers at [facility] o Accepting Medicaid  [facility] ([facility]) Sharin Monso Dauphin, MD; Reed BreechKristi Fleenor, NP; Shelva MajesticWest, MD o [address], [place], [place] [address] o [phone] o Mon-Fri 8:00-5:30, Sat&Sun by appointment (phones open at 8:30) o Babies seen by [facility] providers o Accepting Medicaid o Must be a first-time baby or sibling of current patient  [facility] [place]  o [address], [address], [place], [place]  [address] o [phone]   Fax - [phone]  High [address] [phone])  [facility] o [place], [place]; [place], [place]; [place], MD; [place], [place]; Carolyne FiscalSpry, MD o [address] [place], [place]  [address] o [phone] o Mon-Thur 8:00-7:00, Fri 8:00-5:00, Sat 8:00-12:00, Sun 9:00-12:00 o Babies seen by [facility] providers o Accepting Medicaid  [facility] at [facility] o Coe-Goins, MD; Gaynell FaceMarshall, MD; [facility], MD o [address], [place], [place] [address] o [phone] o Mon-Thur 8:00-5:00 o Babies seen by providers at [facility] o Accepting Medicaid  [facility] at [place] Gwenlyn Sarano Bratton, MD; Coe-Goins, MD; Madilyn FiremanHayes, MD; Melvyn NethLewis, MD; List, MD; Lazarus SalinesLott, MD; Gaynell FaceMarshall, MD; Berneda RoseMoran, MD; Flora Lipps'Neal, MD; Beryl MeagerPierre-Louis, MD; Luther RedoPitonzo, MD; Lavonia DraftsScholer, MD; Kellie SimmeringSpangle, MD o [address] [place], [place] [address] o [phone] o Mon-Fri 8:00-5:30, Sat (Oct.-Mar.) 9:00-1:00 o Babies seen by providers at [facility] o Accepting Medicaid o Must fill out new patient packet, available online at [email or web address]  [facility] - [address] [facility]) Simone Curiao Friddle, NP; Tiburcio PeaHarris, NP; Tresa EndoKelly, NP; Whitney PostLogan, MD; [place], [place]; Hennie DuosPoth, MD; Wynne Dustamadoss, MD; Kavin LeechStanton, NP o [address], [place], [place] [address] o [phone] o Mon-Thur 8:00-5:30, Fri 8:00-5:00 o Babies seen by providers at [facility] o Accepting [facility]  [place] [phone])  [facility] o [place], [place]; Beulah BeachDurham, MD; DrexelPickard, MD; [place], [place] o [address], [place], [place] [address] o [phone] o Mon-Fri 8:00-5:00 o Babies seen by providers at [facility] o Accepting [facility] Hospital[facility]icaid   [place] [phone])  [facility] o [place]lit [place]ner[place], [place]; Lenise ArenaMeyers, MD; [place], [place] o [address], [place], [place] [address] o [phone] o Mon-Fri 8:00-5:00 o Babies seen by providers at [facility] o Does NOT accept Medicaid o Limited appointment availability, please call early in hospitalization   [facility] o HarringtonKunedd, DO;  [place]ow[place], MD o [address], [place], [place] [address] o [phone] o Mon-Fri 8:00-5:00 o Babies seen by [facility] providers o Does NOT accept [place]icaid  [facility] Lorrine Kino Cameron, MD; Ninetta LightsMacDonald, MD; [place], [place]; [place]annah[place]ayak, MD o [address] [facility] [address], [place], [place] [address] o [phone] o Mon-Fri 8:00-5:00 o After hours clinic [facility]([address] [place], [place] [address]) [phone] Mon-Fri 5:00-8:00, Sat 12:00-6:00, Sun 10:00-4:00 o Babies seen by [facility] providers o Accepting [facility] Hospital[facility]  [facility] o [address], [place], [place]  [address] o [phone]   Fax - [phone]  [place] [phone])  Adult [facility]Bauer [facility] at [facility] Cleotilde Neero Andy, MD o [address] [place], [place], [place] [address] o [phone] o Mon-Fri 8:00-5:00 o Babies seen by [facility] providers o Does NOT accept Medicaid  [facility] [place] [facility]) o  Rene KocherEksir, MD o [address], [place], [place] [address] o [phone] o Mon-Thur 8:00-7:00, Fri 8:00-5:00, Sat 8:00-12:00 o Babies seen by providers at [facility] o Accepting Medicaid - but does not have vaccinations in office (must be received elsewhere) o Limited availability, please call early in hospitalization  [place] [phone])  [facility]  o Wyvonne Lenzharlene Flemming, MD o [address], [place] [address] o [phone]  Fax [phone]

## 2019-05-05 LAB — GC/CHLAMYDIA PROBE AMP (~~LOC~~) NOT AT ARMC
Chlamydia: NEGATIVE
Neisseria Gonorrhea: NEGATIVE

## 2019-05-06 LAB — STREP GP B NAA: Strep Gp B NAA: NEGATIVE

## 2019-05-11 ENCOUNTER — Ambulatory Visit (INDEPENDENT_AMBULATORY_CARE_PROVIDER_SITE_OTHER): Payer: Medicaid Other | Admitting: Obstetrics & Gynecology

## 2019-05-11 ENCOUNTER — Encounter: Payer: Self-pay | Admitting: Obstetrics & Gynecology

## 2019-05-11 ENCOUNTER — Other Ambulatory Visit: Payer: Self-pay

## 2019-05-11 VITALS — BP 135/81 | HR 103 | Wt 350.0 lb

## 2019-05-11 DIAGNOSIS — O34219 Maternal care for unspecified type scar from previous cesarean delivery: Secondary | ICD-10-CM

## 2019-05-11 DIAGNOSIS — O0990 Supervision of high risk pregnancy, unspecified, unspecified trimester: Secondary | ICD-10-CM

## 2019-05-11 DIAGNOSIS — O0993 Supervision of high risk pregnancy, unspecified, third trimester: Secondary | ICD-10-CM

## 2019-05-11 DIAGNOSIS — A5901 Trichomonal vulvovaginitis: Secondary | ICD-10-CM

## 2019-05-11 DIAGNOSIS — O23593 Infection of other part of genital tract in pregnancy, third trimester: Secondary | ICD-10-CM

## 2019-05-11 DIAGNOSIS — Z3A37 37 weeks gestation of pregnancy: Secondary | ICD-10-CM

## 2019-05-11 NOTE — Progress Notes (Signed)
   PRENATAL VISIT NOTE  Subjective:  Kellie Boyd is a 23 y.o. [phone] at [redacted]w[redacted]d being seen today for ongoing prenatal care.  She is currently monitored for the following issues for this high-risk pregnancy and has Late prenatal care; Obesity in pregnancy; Supervision of high risk pregnancy, antepartum; BMI 60.0-69.9, adult ([place]); History of cesarean delivery affecting pregnancy; History of VBAC; Lewis isoimmunization during pregnancy; Anemia in pregnancy; Trichomonal vaginitis during pregnancy; and Alpha thalassemia silent carrier on their problem list.  Patient reports occasional contractions.  Contractions: Irregular. Vag. Bleeding: None.  Movement: Present. Denies leaking of fluid.   The following portions of the patient's history were reviewed and updated as appropriate: allergies, current medications, past family history, past medical history, past social history, past surgical history and problem list.   Objective:   Vitals:   05/11/19 1010  BP: 135/81  Pulse: (!) 103  Weight: (!) 350 lb (158.8 kg)    Fetal Status: Fetal Heart Rate (bpm): 147   Movement: Present  Presentation: Vertex  General:  Alert, oriented and cooperative. Patient is in no acute distress.  Skin: Skin is warm and dry. No rash noted.   Cardiovascular: Normal heart rate noted  Respiratory: Normal respiratory effort, no problems with respiration noted  Abdomen: Soft, gravid, appropriate for gestational age.  Pain/Pressure: Present     Pelvic: Cervical exam performed Dilation: 1 Effacement (%): 40 Station: -2  Extremities: Normal range of motion.  Edema: None  Mental Status: Normal mood and affect. Normal behavior. Normal judgment and thought content.   Assessment and Plan:  Pregnancy: [id number] at [redacted]w[redacted]d 1. History of cesarean delivery affecting pregnancy RCS and IUD insertion scheduled 05/27/19.  2. Trichomonal vaginitis during pregnancy in third trimester Will do TOC next week  3. Supervision of high risk  pregnancy, antepartum Term labor symptoms and general obstetric precautions including but not limited to vaginal bleeding, contractions, leaking of fluid and fetal movement were reviewed in detail with the patient. Please refer to After Visit Summary for other counseling recommendations.   Return in about 1 week (around 05/18/2019) for OFFICE OB Visit and TOC (TV) .  Future Appointments  Date Time Provider [place]  05/11/2019 10:45 AM Osborne Oman, MD [facility]  05/13/2019  9:30 AM WH-MFC NURSE [place] MFC-US  05/13/2019  9:30 AM [place] 1 WH-MFCUS MFC-US  05/18/2019  4:15 PM Donnamae Jude, MD [facility]    Verita Schneiders, MD

## 2019-05-11 NOTE — Patient Instructions (Addendum)
Thank you for enrolling in [place]. Please follow the instructions below to securely access your online medical record. MyChart allows you to send messages to your doctor, view your test results, manage appointments, and more.   How Do I Sign Up? 1. In your Internet browser, go to [employer] and enter [email or web address]. 2. Click on the Sign Up Now link in the Sign In box. You will see the New Member Sign Up page. 3. Enter your MyChart Access Code exactly as it appears below. You will not need to use this code after you've completed the sign-up process. If you do not sign up before the expiration date, you must request a new code.  MyChart Access Code: [email or web address] Expires: 06/25/2019 10:26 AM  4. Enter your Social Security Number ([id number]) and Date of Birth (mm/dd/yyyy) as indicated and click Submit. You will be taken to the next sign-up page. 5. Create a MyChart ID. This will be your MyChart login ID and cannot be changed, so think of one that is secure and easy to remember. 6. Create a MyChart password. You can change your password at any time. 7. Enter your Password Reset Question and Answer. This can be used at a later time if you forget your password.  8. Enter your e-mail address. You will receive e-mail notification when new information is available in [place]. 9. Click Sign Up. You can now view your medical record.   Additional Information Remember, MyChart is NOT to be used for urgent needs. For medical emergencies, dial 911.      Return to office for any scheduled appointments. Call the office or go to the MAU at [facility] if:  You begin to have strong, frequent contractions  Your water breaks.  Sometimes it is a big gush of fluid, sometimes it is just a trickle that keeps getting your panties wet or running down your legs  You have vaginal bleeding.  It is normal to have a small amount of spotting if your cervix was  checked.   You do not feel your baby moving like normal.  If you do not, get something to eat and drink and lay down and focus on feeling your baby move.   If your baby is still not moving like normal, you should call the office or go to MAU.  Any other obstetric concerns.

## 2019-05-13 ENCOUNTER — Ambulatory Visit (HOSPITAL_COMMUNITY)
Admission: RE | Admit: 2019-05-13 | Discharge: 2019-05-13 | Disposition: A | Discharge status: Home / Self Care | Payer: Medicaid Other | Source: Ambulatory Visit | Attending: Maternal & Fetal Medicine | Admitting: Maternal & Fetal Medicine

## 2019-05-13 ENCOUNTER — Other Ambulatory Visit: Payer: Self-pay

## 2019-05-13 ENCOUNTER — Ambulatory Visit (HOSPITAL_COMMUNITY): Payer: Medicaid Other | Admitting: *Deleted

## 2019-05-13 VITALS — BP 122/70 | HR 93 | Temp 98.5°F

## 2019-05-13 DIAGNOSIS — O34219 Maternal care for unspecified type scar from previous cesarean delivery: Secondary | ICD-10-CM | POA: Diagnosis not present

## 2019-05-13 DIAGNOSIS — Z362 Encounter for other antenatal screening follow-up: Secondary | ICD-10-CM

## 2019-05-13 DIAGNOSIS — Z3689 Encounter for other specified antenatal screening: Secondary | ICD-10-CM | POA: Diagnosis not present

## 2019-05-13 DIAGNOSIS — Z3A37 37 weeks gestation of pregnancy: Secondary | ICD-10-CM

## 2019-05-13 DIAGNOSIS — Z6841 Body Mass Index (BMI) 40.0 and over, adult: Secondary | ICD-10-CM | POA: Insufficient documentation

## 2019-05-13 DIAGNOSIS — O0933 Supervision of pregnancy with insufficient antenatal care, third trimester: Secondary | ICD-10-CM

## 2019-05-13 DIAGNOSIS — O99213 Obesity complicating pregnancy, third trimester: Secondary | ICD-10-CM | POA: Diagnosis not present

## 2019-05-13 DIAGNOSIS — O99013 Anemia complicating pregnancy, third trimester: Secondary | ICD-10-CM

## 2019-05-17 ENCOUNTER — Encounter (HOSPITAL_COMMUNITY): Payer: Self-pay

## 2019-05-17 NOTE — Patient Instructions (Signed)
Kellie Boyd  05/17/2019   Your procedure is scheduled on:  05/27/2019  Arrive at 43 at Entrance C on [employer] at [facility]  and [employer]. You are invited to use the FREE valet parking or use the Visitor's parking deck.  Pick up the phone at the desk and dial [phone].  Call this number if you have problems the morning of surgery: [phone]  Remember:   Do not eat food:(After Midnight) Desps de medianoche.  Do not drink clear liquids: (After Midnight) Desps de medianoche.  Take these medicines the morning of surgery with A SIP OF WATER:  none   Do not wear jewelry, make-up or nail polish.  Do not wear lotions, powders, or perfumes. Do not wear deodorant.  Do not shave 48 hours prior to surgery.  Do not bring valuables to the hospital.  [facility] is not   responsible for any belongings or valuables brought to the hospital.  Contacts, dentures or bridgework may not be worn into surgery.  Leave suitcase in the car. After surgery it may be brought to your room.  For patients admitted to the hospital, checkout time is 11:00 AM the day of              discharge.      Please read over the following fact sheets that you were given:     Preparing for Surgery

## 2019-05-18 ENCOUNTER — Other Ambulatory Visit: Payer: Self-pay

## 2019-05-18 ENCOUNTER — Other Ambulatory Visit (HOSPITAL_COMMUNITY)
Admission: RE | Admit: 2019-05-18 | Discharge: 2019-05-18 | Disposition: A | Discharge status: Home / Self Care | Payer: Medicaid Other | Source: Ambulatory Visit | Attending: Family Medicine | Admitting: Family Medicine

## 2019-05-18 ENCOUNTER — Ambulatory Visit (INDEPENDENT_AMBULATORY_CARE_PROVIDER_SITE_OTHER): Payer: Medicaid Other | Admitting: Family Medicine

## 2019-05-18 VITALS — BP 113/81 | HR 78 | Wt 354.2 lb

## 2019-05-18 DIAGNOSIS — A5901 Trichomonal vulvovaginitis: Secondary | ICD-10-CM

## 2019-05-18 DIAGNOSIS — O0990 Supervision of high risk pregnancy, unspecified, unspecified trimester: Secondary | ICD-10-CM | POA: Insufficient documentation

## 2019-05-18 DIAGNOSIS — Z98891 History of uterine scar from previous surgery: Secondary | ICD-10-CM

## 2019-05-18 DIAGNOSIS — Z3A38 38 weeks gestation of pregnancy: Secondary | ICD-10-CM

## 2019-05-18 DIAGNOSIS — O34219 Maternal care for unspecified type scar from previous cesarean delivery: Secondary | ICD-10-CM

## 2019-05-18 DIAGNOSIS — O0993 Supervision of high risk pregnancy, unspecified, third trimester: Secondary | ICD-10-CM

## 2019-05-18 DIAGNOSIS — O23593 Infection of other part of genital tract in pregnancy, third trimester: Secondary | ICD-10-CM

## 2019-05-18 NOTE — Patient Instructions (Signed)

## 2019-05-18 NOTE — Progress Notes (Signed)
   PRENATAL VISIT NOTE  Subjective:  Kellie Boyd is a 23 y.o. [phone] at [redacted]w[redacted]d being seen today for ongoing prenatal care.  She is currently monitored for the following issues for this high-risk pregnancy and has Late prenatal care; Obesity in pregnancy; Supervision of high risk pregnancy, antepartum; BMI 60.0-69.9, adult ([place]); History of cesarean delivery affecting pregnancy; History of VBAC; Lewis isoimmunization during pregnancy; Anemia in pregnancy; Trichomonal vaginitis during pregnancy; and Alpha thalassemia silent carrier on their problem list.  Patient reports no complaints.  Contractions: Not present. Vag. Bleeding: None.  Movement: Present. Denies leaking of fluid.   The following portions of the patient's history were reviewed and updated as appropriate: allergies, current medications, past family history, past medical history, past social history, past surgical history and problem list.   Objective:   Vitals:   05/18/19 1609  BP: 113/81  Pulse: 78  Weight: (!) 354 lb 3.2 oz (160.7 kg)    Fetal Status: Fetal Heart Rate (bpm): 145   Movement: Present  Presentation: Vertex  General:  Alert, oriented and cooperative. Patient is in no acute distress.  Skin: Skin is warm and dry. No rash noted.   Cardiovascular: Normal heart rate noted  Respiratory: Normal respiratory effort, no problems with respiration noted  Abdomen: Soft, gravid, appropriate for gestational age.  Pain/Pressure: Absent     Pelvic: Cervical exam performed Dilation: 1 Effacement (%): 40 Station: -3, -2  Extremities: Normal range of motion.  Edema: Trace  Mental Status: Normal mood and affect. Normal behavior. Normal judgment and thought content.   Assessment and Plan:  Pregnancy: [id number] at [redacted]w[redacted]d 1. Supervision of high risk pregnancy, antepartum Continue routine prenatal care.   2. History of cesarean delivery affecting pregnancy Scheduled for RCS and IUD placement at 39 wks  3. History of VBAC  Strongly desires VBAC if goes into labor prior to scheduled C-section  4. Trichomonal vaginitis during pregnancy in third trimester TOC today - Cervicovaginal ancillary only( [place])  Preterm labor symptoms and general obstetric precautions including but not limited to vaginal bleeding, contractions, leaking of fluid and fetal movement were reviewed in detail with the patient. Please refer to After Visit Summary for other counseling recommendations.   Return in 6 weeks (on 06/29/2019).  Future Appointments  Date Time Provider [place]  05/25/2019  7:50 AM MC-MAU 1 MC-INDC None  06/29/2019 10:30 AM Aletha Halim, MD [facility]    Donnamae Jude, MD

## 2019-05-18 NOTE — Progress Notes (Signed)
Doing ok. She would like to be check today.

## 2019-05-20 ENCOUNTER — Other Ambulatory Visit: Payer: Self-pay | Admitting: Family Medicine

## 2019-05-20 DIAGNOSIS — A5901 Trichomonal vulvovaginitis: Secondary | ICD-10-CM

## 2019-05-20 DIAGNOSIS — B3731 Acute candidiasis of vulva and vagina: Secondary | ICD-10-CM

## 2019-05-20 DIAGNOSIS — O23593 Infection of other part of genital tract in pregnancy, third trimester: Secondary | ICD-10-CM

## 2019-05-20 DIAGNOSIS — B373 Candidiasis of vulva and vagina: Secondary | ICD-10-CM

## 2019-05-20 LAB — CERVICOVAGINAL ANCILLARY ONLY
Bacterial vaginitis: NEGATIVE
Candida vaginitis: POSITIVE — AB
Chlamydia: NEGATIVE
Neisseria Gonorrhea: NEGATIVE
Trichomonas: POSITIVE — AB

## 2019-05-20 MED ORDER — TERCONAZOLE 0.8 % VA CREA: 1.0000 | TOPICAL_CREAM | Freq: Every day | VAGINAL | 0 refills | Status: DC

## 2019-05-20 MED ORDER — METRONIDAZOLE 500 MG PO TABS: 1000.0000 mg | ORAL_TABLET | Freq: Two times a day (BID) | ORAL | 1 refills | Status: DC

## 2019-05-21 ENCOUNTER — Inpatient Hospital Stay (HOSPITAL_COMMUNITY): Payer: Medicaid Other

## 2019-05-21 ENCOUNTER — Encounter (HOSPITAL_COMMUNITY): Payer: Self-pay

## 2019-05-21 ENCOUNTER — Other Ambulatory Visit: Payer: Self-pay

## 2019-05-21 ENCOUNTER — Inpatient Hospital Stay (HOSPITAL_COMMUNITY)
Admission: AD | Admit: 2019-05-21 | Discharge: 2019-05-22 | DRG: 806 | Disposition: A | Discharge status: Home / Self Care | Payer: Medicaid Other | Attending: Family Medicine | Admitting: Family Medicine

## 2019-05-21 DIAGNOSIS — O99213 Obesity complicating pregnancy, third trimester: Secondary | ICD-10-CM

## 2019-05-21 DIAGNOSIS — O289 Unspecified abnormal findings on antenatal screening of mother: Secondary | ICD-10-CM

## 2019-05-21 DIAGNOSIS — A5901 Trichomonal vulvovaginitis: Secondary | ICD-10-CM | POA: Diagnosis present

## 2019-05-21 DIAGNOSIS — Z3A38 38 weeks gestation of pregnancy: Secondary | ICD-10-CM

## 2019-05-21 DIAGNOSIS — O34211 Maternal care for low transverse scar from previous cesarean delivery: Principal | ICD-10-CM | POA: Diagnosis present

## 2019-05-21 DIAGNOSIS — O34219 Maternal care for unspecified type scar from previous cesarean delivery: Secondary | ICD-10-CM

## 2019-05-21 DIAGNOSIS — O361990 Maternal care for other isoimmunization, unspecified trimester, not applicable or unspecified: Secondary | ICD-10-CM | POA: Diagnosis present

## 2019-05-21 DIAGNOSIS — Z1159 Encounter for screening for other viral diseases: Secondary | ICD-10-CM

## 2019-05-21 DIAGNOSIS — O99214 Obesity complicating childbirth: Secondary | ICD-10-CM | POA: Diagnosis present

## 2019-05-21 DIAGNOSIS — O0933 Supervision of pregnancy with insufficient antenatal care, third trimester: Secondary | ICD-10-CM

## 2019-05-21 DIAGNOSIS — O99013 Anemia complicating pregnancy, third trimester: Secondary | ICD-10-CM

## 2019-05-21 DIAGNOSIS — O361930 Maternal care for other isoimmunization, third trimester, not applicable or unspecified: Secondary | ICD-10-CM | POA: Diagnosis present

## 2019-05-21 DIAGNOSIS — O26893 Other specified pregnancy related conditions, third trimester: Secondary | ICD-10-CM | POA: Diagnosis present

## 2019-05-21 DIAGNOSIS — D563 Thalassemia minor: Secondary | ICD-10-CM | POA: Diagnosis present

## 2019-05-21 DIAGNOSIS — O9832 Other infections with a predominantly sexual mode of transmission complicating childbirth: Secondary | ICD-10-CM | POA: Diagnosis present

## 2019-05-21 DIAGNOSIS — Z98891 History of uterine scar from previous surgery: Secondary | ICD-10-CM

## 2019-05-21 DIAGNOSIS — O0930 Supervision of pregnancy with insufficient antenatal care, unspecified trimester: Secondary | ICD-10-CM

## 2019-05-21 DIAGNOSIS — O23599 Infection of other part of genital tract in pregnancy, unspecified trimester: Secondary | ICD-10-CM | POA: Diagnosis present

## 2019-05-21 DIAGNOSIS — Z6841 Body Mass Index (BMI) 40.0 and over, adult: Secondary | ICD-10-CM

## 2019-05-21 LAB — PROTEIN / CREATININE RATIO, URINE
Creatinine, Urine: 156.18 mg/dL
Protein Creatinine Ratio: 0.13 mg/mg{Cre} (ref 0.00–0.15)
Total Protein, Urine: 21 mg/dL

## 2019-05-21 LAB — URINALYSIS, ROUTINE W REFLEX MICROSCOPIC
Bacteria, UA: NONE SEEN
Bilirubin Urine: NEGATIVE
Glucose, UA: NEGATIVE mg/dL
Hgb urine dipstick: NEGATIVE
Ketones, ur: NEGATIVE mg/dL
Nitrite: NEGATIVE
Protein, ur: NEGATIVE mg/dL
Specific Gravity, Urine: 1.028 (ref 1.005–1.030)
pH: 6 (ref 5.0–8.0)

## 2019-05-21 LAB — CBC
HCT: 38.9 % (ref 36.0–46.0)
Hemoglobin: 11.8 g/dL — ABNORMAL LOW (ref 12.0–15.0)
MCH: 24.4 pg — ABNORMAL LOW (ref 26.0–34.0)
MCHC: 30.3 g/dL (ref 30.0–36.0)
MCV: 80.5 fL (ref 80.0–100.0)
Platelets: 302 10*3/uL (ref 150–400)
RBC: 4.83 MIL/uL (ref 3.87–5.11)
RDW: 18 % — ABNORMAL HIGH (ref 11.5–15.5)
WBC: 10.8 10*3/uL — ABNORMAL HIGH (ref 4.0–10.5)
nRBC: 0 % (ref 0.0–0.2)

## 2019-05-21 LAB — TYPE AND SCREEN
ABO/RH(D): A POS
Antibody Screen: NEGATIVE

## 2019-05-21 LAB — COMPREHENSIVE METABOLIC PANEL
ALT: 26 U/L (ref 0–44)
AST: 24 U/L (ref 15–41)
Albumin: 2.7 g/dL — ABNORMAL LOW (ref 3.5–5.0)
Alkaline Phosphatase: 129 U/L — ABNORMAL HIGH (ref 38–126)
Anion gap: 12 (ref 5–15)
BUN: 10 mg/dL (ref 6–20)
CO2: 20 mmol/L — ABNORMAL LOW (ref 22–32)
Calcium: 9.2 mg/dL (ref 8.9–10.3)
Chloride: 104 mmol/L (ref 98–111)
Creatinine, Ser: 0.67 mg/dL (ref 0.44–1.00)
GFR calc Af Amer: >60 mL/min (ref >60)
GFR calc non Af Amer: >60 mL/min (ref >60)
Glucose, Bld: 147 mg/dL — ABNORMAL HIGH (ref 70–99)
Potassium: 4 mmol/L (ref 3.5–5.1)
Sodium: 136 mmol/L (ref 135–145)
Total Bilirubin: 0.2 mg/dL — ABNORMAL LOW (ref 0.3–1.2)
Total Protein: 6.8 g/dL (ref 6.5–8.1)

## 2019-05-21 LAB — SARS CORONAVIRUS 2 BY RT PCR (HOSPITAL ORDER, PERFORMED IN ~~LOC~~ HOSPITAL LAB): SARS Coronavirus 2: NEGATIVE

## 2019-05-21 LAB — ABO/RH: ABO/RH(D): A POS

## 2019-05-21 LAB — POCT FERN TEST
POCT Fern Test: POSITIVE
POCT Fern Test: POSITIVE

## 2019-05-21 MED ORDER — EPHEDRINE 5 MG/ML INJ: 10.0000 mg | INTRAVENOUS | Status: DC | PRN

## 2019-05-21 MED ORDER — DIBUCAINE (PERIANAL) 1 % EX OINT: 1.0000 "application " | TOPICAL_OINTMENT | CUTANEOUS | Status: DC | PRN

## 2019-05-21 MED ORDER — OXYCODONE-ACETAMINOPHEN 5-325 MG PO TABS: 2.0000 | ORAL_TABLET | ORAL | Status: DC | PRN

## 2019-05-21 MED ORDER — FLUCONAZOLE 150 MG PO TABS
150.0000 mg | ORAL_TABLET | Freq: Once | ORAL | Status: AC
  Administered 2019-05-21: 150 mg via ORAL
  Filled 2019-05-21: qty 1

## 2019-05-21 MED ORDER — OXYTOCIN 40 UNITS IN NORMAL SALINE INFUSION - SIMPLE MED
INTRAVENOUS | Status: AC
  Filled 2019-05-21: qty 1000

## 2019-05-21 MED ORDER — OXYTOCIN 40 UNITS IN NORMAL SALINE INFUSION - SIMPLE MED: 2.5000 [IU]/h | INTRAVENOUS | Status: DC

## 2019-05-21 MED ORDER — PRENATAL MULTIVITAMIN CH
1.0000 | ORAL_TABLET | Freq: Every day | ORAL | Status: DC
  Administered 2019-05-22: 1 via ORAL
  Filled 2019-05-21: qty 1

## 2019-05-21 MED ORDER — DIPHENHYDRAMINE HCL 50 MG/ML IJ SOLN: 12.5000 mg | INTRAMUSCULAR | Status: DC | PRN

## 2019-05-21 MED ORDER — ONDANSETRON HCL 4 MG/2ML IJ SOLN: 4.0000 mg | INTRAMUSCULAR | Status: DC | PRN

## 2019-05-21 MED ORDER — PHENYLEPHRINE 40 MCG/ML (10ML) SYRINGE FOR IV PUSH (FOR BLOOD PRESSURE SUPPORT): 80.0000 ug | PREFILLED_SYRINGE | INTRAVENOUS | Status: DC | PRN

## 2019-05-21 MED ORDER — OXYTOCIN 10 UNIT/ML IJ SOLN: 10.0000 [IU] | Freq: Once | INTRAMUSCULAR | Status: DC

## 2019-05-21 MED ORDER — TETANUS-DIPHTH-ACELL PERTUSSIS 5-2.5-18.5 LF-MCG/0.5 IM SUSP: 0.5000 mL | Freq: Once | INTRAMUSCULAR | Status: DC

## 2019-05-21 MED ORDER — ZOLPIDEM TARTRATE 5 MG PO TABS: 5.0000 mg | ORAL_TABLET | Freq: Every evening | ORAL | Status: DC | PRN

## 2019-05-21 MED ORDER — COCONUT OIL OIL: 1.0000 "application " | TOPICAL_OIL | Status: DC | PRN

## 2019-05-21 MED ORDER — IBUPROFEN 600 MG PO TABS
600.0000 mg | ORAL_TABLET | Freq: Four times a day (QID) | ORAL | Status: DC
  Administered 2019-05-21 – 2019-05-22 (×5): 600 mg via ORAL
  Filled 2019-05-21 (×5): qty 1

## 2019-05-21 MED ORDER — LACTATED RINGERS IV SOLN: 500.0000 mL | Freq: Once | INTRAVENOUS | Status: DC

## 2019-05-21 MED ORDER — ACETAMINOPHEN 325 MG PO TABS
650.0000 mg | ORAL_TABLET | ORAL | Status: DC | PRN
  Administered 2019-05-21 (×2): 650 mg via ORAL
  Filled 2019-05-21 (×2): qty 2

## 2019-05-21 MED ORDER — SIMETHICONE 80 MG PO CHEW: 80.0000 mg | CHEWABLE_TABLET | ORAL | Status: DC | PRN

## 2019-05-21 MED ORDER — DIPHENHYDRAMINE HCL 25 MG PO CAPS: 25.0000 mg | ORAL_CAPSULE | Freq: Four times a day (QID) | ORAL | Status: DC | PRN

## 2019-05-21 MED ORDER — OXYTOCIN BOLUS FROM INFUSION: 500.0000 mL | Freq: Once | INTRAVENOUS | Status: DC

## 2019-05-21 MED ORDER — LACTATED RINGERS IV SOLN: INTRAVENOUS | Status: DC

## 2019-05-21 MED ORDER — OXYCODONE HCL 5 MG PO TABS
5.0000 mg | ORAL_TABLET | ORAL | Status: DC | PRN
  Administered 2019-05-21: 5 mg via ORAL
  Filled 2019-05-21 (×2): qty 1

## 2019-05-21 MED ORDER — FLEET ENEMA 7-19 GM/118ML RE ENEM: 1.0000 | ENEMA | RECTAL | Status: DC | PRN

## 2019-05-21 MED ORDER — SENNOSIDES-DOCUSATE SODIUM 8.6-50 MG PO TABS
2.0000 | ORAL_TABLET | ORAL | Status: DC
  Administered 2019-05-21: 2 via ORAL
  Filled 2019-05-21: qty 2

## 2019-05-21 MED ORDER — FENTANYL-BUPIVACAINE-NACL 0.5-0.125-0.9 MG/250ML-% EP SOLN: 12.0000 mL/h | EPIDURAL | Status: DC | PRN

## 2019-05-21 MED ORDER — OXYCODONE-ACETAMINOPHEN 5-325 MG PO TABS: 1.0000 | ORAL_TABLET | ORAL | Status: DC | PRN

## 2019-05-21 MED ORDER — ACETAMINOPHEN 325 MG PO TABS: 650.0000 mg | ORAL_TABLET | ORAL | Status: DC | PRN

## 2019-05-21 MED ORDER — ONDANSETRON HCL 4 MG/2ML IJ SOLN: 4.0000 mg | Freq: Four times a day (QID) | INTRAMUSCULAR | Status: DC | PRN

## 2019-05-21 MED ORDER — LIDOCAINE HCL (PF) 1 % IJ SOLN: 30.0000 mL | INTRAMUSCULAR | Status: DC | PRN

## 2019-05-21 MED ORDER — OXYTOCIN 10 UNIT/ML IJ SOLN
INTRAMUSCULAR | Status: AC
  Administered 2019-05-21: 10 [IU]
  Filled 2019-05-21: qty 1

## 2019-05-21 MED ORDER — ONDANSETRON HCL 4 MG PO TABS: 4.0000 mg | ORAL_TABLET | ORAL | Status: DC | PRN

## 2019-05-21 MED ORDER — METRONIDAZOLE 500 MG PO TABS
500.0000 mg | ORAL_TABLET | Freq: Two times a day (BID) | ORAL | Status: DC
  Administered 2019-05-21 (×2): 500 mg via ORAL
  Filled 2019-05-21 (×5): qty 1

## 2019-05-21 MED ORDER — SOD CITRATE-CITRIC ACID 500-334 MG/5ML PO SOLN: 30.0000 mL | ORAL | Status: DC | PRN

## 2019-05-21 MED ORDER — BENZOCAINE-MENTHOL 20-0.5 % EX AERO
1.0000 "application " | INHALATION_SPRAY | CUTANEOUS | Status: DC | PRN
  Administered 2019-05-21: 1 via TOPICAL
  Filled 2019-05-21: qty 56

## 2019-05-21 MED ORDER — WITCH HAZEL-GLYCERIN EX PADS: 1.0000 "application " | MEDICATED_PAD | CUTANEOUS | Status: DC | PRN

## 2019-05-21 MED ORDER — LACTATED RINGERS IV SOLN: 500.0000 mL | INTRAVENOUS | Status: DC | PRN

## 2019-05-21 NOTE — Progress Notes (Signed)
Patient found with infant asleep on her chest. Infant taken to crib, mom counseled on baby safe sleep. Safety sheet explained however mom seemed exhausted.

## 2019-05-21 NOTE — MAU Note (Addendum)
Called L&D Charge Nurse Nira Conn) and told her that patient likely SROM, was now 6cm. We were unable to get IV/Labs after 2 RNs tried.  Fern slide was positive; informed L&D CN and bedside RN.  .    Prior to moving patient; difficult to get fetal tracing d/t patient moving during contractions/unable to sit back. Second hand called for assist and fetal tracing obtained before transport.

## 2019-05-21 NOTE — Lactation Note (Signed)
This note was copied from a baby's chart. Lactation Consultation Note  Patient Name: Kellie Boyd Date: 05/21/2019 Reason for consult: Initial assessment;Early term 72-38.6wks  5 hours old ETI who is being exclusively BF by his mother, she's a P4 but not very experienced BF. She was able to BF her other kids for about 2 weeks but had to stop due to pain (uterine cramping mainly and some breast pain too). Mom is already familiar with hand expression, she was able to do teach back to [facility] and a small drop of colostrum was noted on her right breast. Mom has large breasts but her tissue is compressible. She doesn't have a pump at home, but she's planing to get a DEBP at [place], [employer] recommended a couple of brands, mom declined a hand pump from the hospital.  Offered assistance with latch but mom told LC that baby just fed around noon, he had a 12 minutes feeding. Asked mom to call for assistance when needed. MBU Director Kenney Houseman was in the room checking on baby's signs, mom thought he was jittery, but baby stayed asleep while during [facility] consultation. Reviewed feeding cues, cluster feeding and normal newborn behavior.   Feeding plan:  1. Encourage mom to feed baby STS 8-12 times/24 hours or sooner if feeding cues are present 2. Hand expression and spoon feeding were also encouraged  BF brochure, BF resources and feeding diary were reviewed. Mom reported all questions and concerns were answered, she's aware of [place] services and will call PRN.  Maternal Data Formula Feeding for Exclusion: No Has patient been taught Hand Expression?: Yes Does the patient have breastfeeding experience prior to this delivery?: Yes  Feeding Feeding Type: Breast Fed  LATCH Score Latch: Repeated attempts needed to sustain latch, nipple held in mouth throughout feeding, stimulation needed to elicit sucking reflex.  Audible Swallowing: Spontaneous and intermittent  Type of Nipple: Everted at rest and after  stimulation  Comfort (Breast/Nipple): Soft / non-tender  Hold (Positioning): Assistance needed to correctly position infant at breast and maintain latch.  LATCH Score: 8  Interventions Interventions: Breast feeding basics reviewed;Hand express;Breast compression  Lactation Tools Discussed/Used WIC Program: No   Consult Status Consult Status: PRN Date: 05/22/19 Follow-up type: In-patient    [place] Ganus Francene Boyers 05/21/2019, 2:06 PM

## 2019-05-21 NOTE — H&P (Signed)
Quincie Haroon is a 23 y.o. female [phone] @ 38.5wks presenting for reg ctx with cervical change. She proceeded to have SROM in MAU at which point she had progressed to 6cm. Denied bldg; no H/A, N/V or visual disturbances. Her preg has been followed since 28wks by the [facility] office and has been remarkable for:  # hx of C/S, VBAC, then C/S: scheduled for rLTCS next week but prefers to labor spontaneously and deliver vaginally- she has been counseled regarding this and understands risks and signed consent for VBAC on 03/23/19 # recent dx of trichomonas (neg TOC on 7/14; had initial positive 04/06/19) # yeast on 7/14 # late to care  # morbid obesity (BMI 65) # Lewis isoimmunization during preg  OB History    Gravida  6   Para  3   Term  2   Preterm  1   AB  2   Living  3     SAB  1   TAB      Ectopic      Multiple      Live Births  3          Past Medical History:  Diagnosis Date  . Asthma   . Medical history non-contributory    Past Surgical History:  Procedure Laterality Date  . CESAREAN SECTION     x2. Patient had VBAC in between two c-sections   Family History: family history includes Diabetes in her maternal grandmother and paternal grandmother. Social History:  reports that she has never smoked. She has never used smokeless tobacco. She reports previous alcohol use. She reports previous drug use.     Maternal Diabetes: No Genetic Screening: Declined- too late Maternal Ultrasounds/Referrals: Normal Fetal Ultrasounds or other Referrals:  None Maternal Substance Abuse:  No Significant Maternal Medications:  None Significant Maternal Lab Results:  Group B Strep negative and Other:  Other Comments:  +trichomonas 7/14 (not tx prior to del)  ROS History Dilation: 7.5 Effacement (%): 100 Station: -1 Exam by:: [place], rn Blood pressure 131/81, pulse 93, temperature 97.9 F (36.6 C), temperature source Oral, resp. rate 20, height 5\' 2"  (1.575 m), weight  (!) 161 kg, last menstrual period 08/23/2018, SpO2 99 %. Exam Physical Exam  Constitutional: She is oriented to person, place, and time. She appears well-developed.  obese  HENT:  Head: Normocephalic.  Neck: Normal range of motion.  Cardiovascular: Normal rate.  Respiratory: Effort normal.  GI:  EFM 135, occ variables, decreased variability Ctx q 3-4 mins  Musculoskeletal: Normal range of motion.  Neurological: She is alert and oriented to person, place, and time.  Skin: Skin is warm and dry.  Psychiatric: She has a normal mood and affect. Her behavior is normal. Thought content normal.    Prenatal labs: ABO, Rh: A/Positive/-- (05/07 1439) Antibody: Positive, See Final Results (05/07 1439) Rubella: 10.90 (05/07 1439) RPR: Non Reactive (05/07 1439)  HBsAg: Negative (05/07 1439)  HIV: Non Reactive (05/07 1439)  GBS: Negative (06/30 1420)   Assessment/Plan: IUP@38 .5wks Prev C/S x 2- desires VBAC Active labor/transition + Trichomonas 7/14 (no tx)  Admit to Labor and Delivery Expectant management Anticipate VBAC Plan to tx trichomonas PP Will plan for IUD at [facility] visit rather than postplacental due to +trichomonas   Corinne 05/21/2019, 8:38 AM

## 2019-05-21 NOTE — Discharge Summary (Signed)
Postpartum Discharge Summary     Patient Name: Kellie Boyd DOB: Apr 07, 1996 MRN: [id number]  Date of admission: 05/21/2019 Delivering Provider: Lavonda JumboAUTRY-LOTT, SIMONE   Date of discharge: 05/22/2019  Admitting diagnosis: 39WKS CTX Intrauterine pregnancy: 9862w5d     Secondary diagnosis:  Active Problems:   Late prenatal care   BMI 60.0-69.9, adult (HCC)   History of cesarean delivery affecting pregnancy   History of VBAC   Lewis isoimmunization during pregnancy   Trichomonal vaginitis during pregnancy   Alpha thalassemia silent carrier  Additional problems: none     Discharge diagnosis: Term Pregnancy Delivered and VBAC                                                                                                Post partum procedures:None  Augmentation: none  Complications: None  Hospital course:  Onset of Labor With Vaginal Delivery     23 y.o. yo [id number] at 2962w5d was admitted in Latent Labor on 05/21/2019. Patient had an uncomplicated labor course progressing rapidly after SROM to successful VBAC delivery precipitously as follows:  Membrane Rupture Time/Date: 8:10 AM ,05/21/2019   Intrapartum Procedures: Episiotomy: None [1]                                         Lacerations:  1st degree [2];Periurethral [8]  Patient had a delivery of a Viable infant. 05/21/2019  Information for the patient's newborn:  Aretta NipBluford, Boy Ezme [[id number]][[id number]]  Delivery Method: VBAC, Spontaneous(Filed from Delivery Summary)     Pateint had a postpartum course remarkable for receiving metronidazole 500mg  bid x 7d for a trichomonas dx on 05/18/19. She received 2 doses and was then given 2g dosage prior to discharge.  Her partner Tamala Fothergill(Darryl Robertson) was given an rx for metronidazole 2gm x 1 with appropriate precautions.  She is ambulating, tolerating a regular diet, passing flatus, and urinating well.   Patient is discharged home in stable condition on 05/22/19.   Magnesium Sulfate recieved: No BMZ  received: No  Physical exam  Vitals:   05/21/19 1528 05/21/19 2000 05/21/19 2356 05/22/19 0556  BP: 101/78 115/67 112/61 113/63  Pulse:  81 79 75  Resp:  18 18 18   Temp:  98.2 F (36.8 C) 98.3 F (36.8 C) 98.2 F (36.8 C)  TempSrc:  Oral Oral Oral  SpO2:  98% 100% 99%  Weight:      Height:       General: alert, cooperative and no distress Obese  Chest: Lungs CTA, Heart RRR Abdomen: Soft, Non Tender, BSx4 Lochia: appropriate Uterine Fundus: firm Incision: N/A DVT Evaluation: No evidence of DVT seen on physical exam. Calf/Ankle edema is present Labs: Lab Results  Component Value Date   WBC 10.8 (H) 05/21/2019   HGB 11.8 (L) 05/21/2019   HCT 38.9 05/21/2019   MCV 80.5 05/21/2019   PLT 302 05/21/2019   CMP Latest Ref Rng & Units 05/21/2019  Glucose 70 - 99 mg/dL 696(E147(H)  BUN 6 - 20 mg/dL 10  Creatinine 0.44 - 1.00 mg/dL 0.67  Sodium 135 - 145 mmol/L 136  Potassium 3.5 - 5.1 mmol/L 4.0  Chloride 98 - 111 mmol/L 104  CO2 22 - 32 mmol/L 20(L)  Calcium 8.9 - 10.3 mg/dL 9.2  Total Protein 6.5 - 8.1 g/dL 6.8  Total Bilirubin 0.3 - 1.2 mg/dL 0.2(L)  Alkaline Phos 38 - 126 U/L 129(H)  AST 15 - 41 U/L 24  ALT 0 - 44 U/L 26    Discharge instruction: per After Visit Summary and "Baby and Me Booklet". Pain Management, Peri-Care, Breastfeeding, Who and When to call for postpartum complications. Information Sheet(s) given Care after Vaginal Delivery   After visit meds:  Allergies as of 05/22/2019   No Known Allergies     Medication List    STOP taking these medications   metroNIDAZOLE 500 MG tablet Commonly known as: FLAGYL   pantoprazole 20 MG tablet Commonly known as: Protonix   terconazole 0.8 % vaginal cream Commonly known as: TERAZOL 3     TAKE these medications   ibuprofen 600 MG tablet Commonly known as: ADVIL Take 1 tablet (600 mg total) by mouth every 6 (six) hours.   PrePLUS 27-1 MG Tabs Take 1 tablet by mouth daily.       Diet: routine  diet  Activity: Advance as tolerated. Pelvic rest for 6 weeks.   Outpatient follow up:4 weeks Follow up Appt: Future Appointments  Date Time Provider [place]  05/25/2019  7:50 AM [facility] 1 [facility] None  06/29/2019 10:30 AM Aletha Halim, MD [facility]   Follow up Visit: [place] [facility] at [facility] Follow up on 06/29/2019.   Specialty: Obstetrics and Gynecology Contact information: [address] [place] [id number]          Please schedule this patient for Postpartum visit in: 4 weeks with the following provider: Any provider For C/S patients schedule nurse incision check in weeks 2 weeks: no High risk pregnancy complicated by: prev C/S x 2, +trich 7/14 (not tx prior to delivery) Delivery mode:  SVD Anticipated Birth Control:  IUD- at [facility] PP Procedures needed: the sticky note says she needs a Pap PP, but CMA note on 5/11 says she had a normal Pap in 2020- please verify  Schedule Integrated [place] visit: no   Newborn Data: Live born female  Birth Weight: 7 lb 13.2 oz (3550 g) APGAR: 8, 9  Newborn Delivery   Birth date/time: 05/21/2019 08:48:00 Delivery type: VBAC, Spontaneous      Baby Feeding: Bottle and Breast Disposition:home with mother   05/22/2019 Maryann Conners, CNM

## 2019-05-22 LAB — RPR: RPR Ser Ql: NONREACTIVE

## 2019-05-22 MED ORDER — IBUPROFEN 600 MG PO TABS: 600.0000 mg | ORAL_TABLET | Freq: Four times a day (QID) | ORAL | 0 refills | Status: AC

## 2019-05-22 MED ORDER — METRONIDAZOLE 500 MG PO TABS
2000.0000 mg | ORAL_TABLET | Freq: Once | ORAL | Status: AC
  Administered 2019-05-22: 2000 mg via ORAL
  Filled 2019-05-22: qty 4

## 2019-05-22 NOTE — Discharge Instructions (Signed)
Trichomoniasis Trichomoniasis is an STI (sexually transmitted infection) that can affect both women and men. In women, the outer area of the female genitalia (vulva) and the vagina are affected. In men, mainly the penis is affected, but the prostate and other reproductive organs can also be involved.  This condition can be treated with medicine. It often has no symptoms (is asymptomatic), especially in men. If not treated, trichomoniasis can last for months or years. What are the causes? This condition is caused by a parasite called Trichomonas vaginalis. Trichomoniasis most often spreads from person to person (is contagious) through sexual contact. What increases the risk? The following factors may make you more likely to develop this condition: Having unprotected sex. Having sex with a partner who has trichomoniasis. Having multiple sexual partners. Having had previous trichomoniasis infections or other STIs. What are the signs or symptoms? In women, symptoms of trichomoniasis include: Abnormal vaginal discharge that is clear, white, gray, or yellow-green and foamy and has an unusual "fishy" odor. Itching and irritation of the vagina and vulva. Burning or pain during urination or sex. Redness and swelling of the genitals. In men, symptoms of trichomoniasis include: Penile discharge that may be foamy or contain pus. Pain in the penis. This may happen only when urinating. Itching or irritation inside the penis. Burning after urination or ejaculation. How is this diagnosed? In women, this condition may be found during a routine Pap test or physical exam. It may be found in men during a routine physical exam. Your health care provider may do tests to help diagnose this infection, such as: Urine tests (men and women). The following in women: Testing the pH of the vagina. A vaginal swab test that checks for the Trichomonas vaginalis parasite. Testing vaginal secretions. Your health care  provider may test you for other STIs, including HIV (human immunodeficiency virus). How is this treated? This condition is treated with medicine taken by mouth (orally), such as metronidazole or tinidazole, to fight the infection. Your sexual partner(s) also need to be tested and treated. If you are a woman and you plan to become pregnant or think you may be pregnant, tell your health care provider right away. Some medicines that are used to treat the infection should not be taken during pregnancy. Your health care provider may recommend over-the-counter medicines or creams to help relieve itching or irritation. You may be tested for infection again 3 months after treatment. Follow these instructions at home: Take and use over-the-counter and prescription medicines, including creams, only as told by your health care provider. Take your antibiotic medicine as told by your health care provider. Do not stop taking the antibiotic even if you start to feel better. Do not have sex until 7-10 days after you finish your medicine, or until your health care provider approves. Ask your health care provider when you may start to have sex again. (Women) Do not douche or wear tampons while you have the infection. Discuss your infection with your sexual partner(s). Make sure that your partner gets tested and treated, if necessary. Keep all follow-up visits as told by your health care provider. This is important. How is this prevented?  Use condoms every time you have sex. Using condoms correctly and consistently can help protect against STIs. Avoid having multiple sexual partners. Talk with your sexual partner about any symptoms that either of you may have, as well as any history of STIs. Get tested for STIs and STDs (sexually transmitted diseases) before you have sex.  Ask your partner to do the same. Do not have sexual contact if you have symptoms of trichomoniasis or another STI. Contact a health care provider  if: You still have symptoms after you finish your medicine. You develop pain in your abdomen. You have pain when you urinate. You have bleeding after sex. You develop a rash. You feel nauseous or you vomit. You plan to become pregnant or think you may be pregnant. Summary Trichomoniasis is an STI (sexually transmitted infection) that can affect both women and men. This condition often has no symptoms (is asymptomatic), especially in men. Without treatment, this condition can last for months or years. You should not have sex until 7-10 days after you finish your medicine, or until your health care provider approves. Ask your health care provider when you may start to have sex again. Discuss your infection with your sexual partner(s). Make sure that your partner gets tested and treated, if necessary. This information is not intended to replace advice given to you by your health care provider. Make sure you discuss any questions you have with your health care provider. Document Released: 04/16/2001 Document Revised: 08/04/2018 Document Reviewed: 08/04/2018 Elsevier Patient Education  2020 [employer]   Vaginal Birth After Cesarean Delivery  Vaginal birth after cesarean delivery (VBAC) is giving birth vaginally after previously delivering a baby through a cesarean section (C-section). A VBAC may be a safe option for you, depending on your health and other factors. It is important to discuss VBAC with your health care provider early in your pregnancy so you can understand the risks, benefits, and options. Having these discussions early will give you time to make your birth plan. Who are the best candidates for VBAC? The best candidates for VBAC are women who:  Have had one or two prior cesarean deliveries, and the incision made during the delivery was horizontal (low transverse).  Do not have a vertical (classical) scar on their uterus.  Have not had a tear in the wall of their uterus  (uterine rupture).  Plan to have more pregnancies. A VBAC is also more likely to be successful:  In women who have previously given birth vaginally.  When labor starts by itself (spontaneously) before the due date. What are the benefits of VBAC? The benefits of delivering your baby vaginally instead of by a cesarean delivery include:  A shorter hospital stay.  A faster recovery time.  Less pain.  Avoiding risks associated with major surgery, such as infection and blood clots.  Less blood loss and less need for donated blood (transfusions). What are the risks of VBAC? The main risk of attempting a VBAC is that it may fail, forcing your health care provider to deliver your baby by a C-section. Other risks are rare and include:  Tearing (rupture) of the scar from a past cesarean delivery.  Other risks associated with vaginal deliveries. If a repeat cesarean delivery is needed, the risks include:  Blood loss.  Infection.  Blood clot.  Damage to surrounding organs.  Removal of the uterus (hysterectomy), if it is damaged.  Placenta problems in future pregnancies. What else should I know about my options? Delivering a baby through a VBAC is similar to having a normal spontaneous vaginal delivery. Therefore, it is safe:  To try with twins.  For your health care provider to try to turn the baby from a breech position (external cephalic version) during labor.  With epidural analgesia for pain relief. Consider where you would like to  deliver your baby. VBAC should be attempted in facilities where an emergency cesarean delivery can be performed. VBAC is not recommended for home births. Any changes in your health or your babys health during your pregnancy may make it necessary to change your initial decision about VBAC. Your health care provider may recommend that you do not attempt a VBAC if:  Your baby's suspected weight is 8.8 lb (4 kg) or more.  You have preeclampsia.  This is a condition that causes high blood pressure along with other symptoms, such as swelling and headaches.  You will have VBAC less than 19 months after your cesarean delivery.  You are past your due date.  You need to have labor started (induced) because your cervix is not ready for labor (unfavorable). Where to find more information  American Pregnancy Association: americanpregnancy.org  [employer] of Obstetricians and Gynecologists: acog.org Summary  Vaginal birth after cesarean delivery (VBAC) is giving birth vaginally after previously delivering a baby through a cesarean section (C-section). A VBAC may be a safe option for you, depending on your health and other factors.  Discuss VBAC with your health care provider early in your pregnancy so you can understand the risks, benefits, options, and have plenty of time to make your birth plan.  The main risk of attempting a VBAC is that it may fail, forcing your health care provider to deliver your baby by a C-section. Other risks are rare. This information is not intended to replace advice given to you by your health care provider. Make sure you discuss any questions you have with your health care provider. Document Released: 04/13/2007 Document Revised: 02/16/2019 Document Reviewed: 01/28/2017 Elsevier Patient Education  2020 [employer] Levonorgestrel intrauterine device (IUD) What is this medicine? LEVONORGESTREL IUD (LEE voe nor jes trel) is a contraceptive (birth control) device. The device is placed inside the uterus by a healthcare professional. It is used to prevent pregnancy. This device can also be used to treat heavy bleeding that occurs during your period. This medicine may be used for other purposes; ask your health care provider or pharmacist if you have questions. COMMON BRAND NAME(S): Cameron AliKyleena, LILETTA, Mirena, Skyla What should I tell my health care provider before I take this medicine? They need to know if  you have any of these conditions:  abnormal Pap smear  cancer of the breast, uterus, or cervix  diabetes  endometritis  genital or pelvic infection now or in the past  have more than one sexual partner or your partner has more than one partner  heart disease  history of an ectopic or tubal pregnancy  immune system problems  IUD in place  liver disease or tumor  problems with blood clots or take blood-thinners  seizures  use intravenous drugs  uterus of unusual shape  vaginal bleeding that has not been explained  an unusual or allergic reaction to levonorgestrel, other hormones, silicone, or polyethylene, medicines, foods, dyes, or preservatives  pregnant or trying to get pregnant  breast-feeding How should I use this medicine? This device is placed inside the uterus by a health care professional. Talk to your pediatrician regarding the use of this medicine in children. Special care may be needed. Overdosage: If you think you have taken too much of this medicine contact a poison control center or emergency room at once. NOTE: This medicine is only for you. Do not share this medicine with others. What if I miss a dose? This does not apply. Depending on the  brand of device you have inserted, the device will need to be replaced every 3 to 6 years if you wish to continue using this type of birth control. What may interact with this medicine? Do not take this medicine with any of the following medications:  amprenavir  bosentan  fosamprenavir This medicine may also interact with the following medications:  aprepitant  armodafinil  barbiturate medicines for inducing sleep or treating seizures  bexarotene  boceprevir  griseofulvin  medicines to treat seizures like carbamazepine, ethotoin, felbamate, oxcarbazepine, phenytoin, topiramate  modafinil  pioglitazone  rifabutin  rifampin  rifapentine  some medicines to treat HIV infection like  atazanavir, efavirenz, indinavir, lopinavir, nelfinavir, tipranavir, ritonavir  St. John's wort  warfarin This list may not describe all possible interactions. Give your health care provider a list of all the medicines, herbs, non-prescription drugs, or dietary supplements you use. Also tell them if you smoke, drink alcohol, or use illegal drugs. Some items may interact with your medicine. What should I watch for while using this medicine? Visit your doctor or health care professional for regular check ups. See your doctor if you or your partner has sexual contact with others, becomes HIV positive, or gets a sexual transmitted disease. This product does not protect you against HIV infection (AIDS) or other sexually transmitted diseases. You can check the placement of the IUD yourself by reaching up to the top of your vagina with clean fingers to feel the threads. Do not pull on the threads. It is a good habit to check placement after each menstrual period. Call your doctor right away if you feel more of the IUD than just the threads or if you cannot feel the threads at all. The IUD may come out by itself. You may become pregnant if the device comes out. If you notice that the IUD has come out use a backup birth control method like condoms and call your health care provider. Using tampons will not change the position of the IUD and are okay to use during your period. This IUD can be safely scanned with magnetic resonance imaging (MRI) only under specific conditions. Before you have an MRI, tell your healthcare provider that you have an IUD in place, and which type of IUD you have in place. What side effects may I notice from receiving this medicine? Side effects that you should report to your doctor or health care professional as soon as possible:  allergic reactions like skin rash, itching or hives, swelling of the face, lips, or tongue  fever, flu-like symptoms  genital sores  high blood  pressure  no menstrual period for 6 weeks during use  pain, swelling, warmth in the leg  pelvic pain or tenderness  severe or sudden headache  signs of pregnancy  stomach cramping  sudden shortness of breath  trouble with balance, talking, or walking  unusual vaginal bleeding, discharge  yellowing of the eyes or skin Side effects that usually do not require medical attention (report to your doctor or health care professional if they continue or are bothersome):  acne  breast pain  change in sex drive or performance  changes in weight  cramping, dizziness, or faintness while the device is being inserted  headache  irregular menstrual bleeding within first 3 to 6 months of use  nausea This list may not describe all possible side effects. Call your doctor for medical advice about side effects. You may report side effects to FDA at 1-800-FDA-1088. Where should I  keep my medicine? This does not apply. NOTE: This sheet is a summary. It may not cover all possible information. If you have questions about this medicine, talk to your doctor, pharmacist, or health care provider.  2020 Elsevier/Gold Standard (2018-09-01 13:22:01)

## 2019-05-22 NOTE — Lactation Note (Signed)
This note was copied from a baby's chart. Lactation Consultation Note  Patient Name: Kellie Boyd [name] [id number] Date: 05/22/2019 Reason for consult: Follow-up assessment;Early term 37-38.6wks;Infant weight loss Baby is 27 hours  As LC entered the room . Mom latched the baby with depth and swallows noted.  Per mom comfortable baby fed for 15 mins and was satisfied after the feeding.  Mom mentioned she is some pinching initially with latch and improves as baby  Feeds. LC offered positioning options. LC reviewed sore nipples and engorgement prevention and tx.  Instructed mom on the use of a hand pump and provided a #27 F for when the milk comes in.  [facility] reviewed lactation resources for after D/C for [facility].   Maternal Data Has patient been taught Hand Expression?: Yes  Feeding Breast  LATCH Score Latch: Grasps breast easily, tongue down, lips flanged, rhythmical sucking.  Audible Swallowing: Spontaneous and intermittent  Type of Nipple: Everted at rest and after stimulation  Comfort (Breast/Nipple): Soft / non-tender  Hold (Positioning): No assistance needed to correctly position infant at breast.  LATCH Score: 10  Interventions Interventions: Breast feeding basics reviewed;Hand pump  Lactation Tools Discussed/Used Tools: Pump Breast pump type: Manual Pump Review: Setup, frequency, and cleaning;Milk Storage Initiated by:: MAI Date initiated:: 05/22/19   Consult Status Consult Status: Complete Date: 05/22/19    [name] [name] 05/22/2019, 12:06 PM

## 2019-05-24 ENCOUNTER — Encounter (HOSPITAL_COMMUNITY): Payer: Self-pay | Admitting: Anesthesiology

## 2019-05-25 ENCOUNTER — Other Ambulatory Visit (HOSPITAL_COMMUNITY)
Admission: RE | Admit: 2019-05-25 | Discharge: 2019-05-25 | Disposition: A | Discharge status: Home / Self Care | Payer: Medicaid Other | Source: Ambulatory Visit

## 2019-05-25 HISTORY — DX: Unspecified asthma, uncomplicated: J45.909

## 2019-05-26 ENCOUNTER — Encounter (HOSPITAL_COMMUNITY): Payer: Self-pay | Admitting: Family Medicine

## 2019-05-27 ENCOUNTER — Encounter (HOSPITAL_COMMUNITY): Payer: Self-pay | Admitting: *Deleted

## 2019-05-27 ENCOUNTER — Telehealth (HOSPITAL_COMMUNITY): Payer: Self-pay | Admitting: *Deleted

## 2019-05-27 ENCOUNTER — Inpatient Hospital Stay (HOSPITAL_COMMUNITY)
Admission: RE | Admit: 2019-05-27 | Payer: Medicaid Other | Source: Home / Self Care | Admitting: Obstetrics and Gynecology

## 2019-05-27 ENCOUNTER — Encounter (HOSPITAL_COMMUNITY): Admission: RE | Payer: Self-pay | Source: Home / Self Care

## 2019-05-27 ENCOUNTER — Other Ambulatory Visit: Payer: Self-pay

## 2019-05-27 SURGERY — Surgical Case
Anesthesia: Regional

## 2019-05-27 MED ORDER — DEXTROSE 5 % IV SOLN
3.0000 g | INTRAVENOUS | Status: DC
  Filled 2019-05-27: qty 3000

## 2019-05-27 MED ORDER — SOD CITRATE-CITRIC ACID 500-334 MG/5ML PO SOLN: 30.0000 mL | ORAL | Status: DC

## 2019-05-27 MED ORDER — LACTATED RINGERS IV SOLN: 125.0000 mL/h | INTRAVENOUS | Status: DC

## 2019-06-14 ENCOUNTER — Telehealth: Payer: Self-pay | Admitting: *Deleted

## 2019-06-14 MED ORDER — CYCLOBENZAPRINE HCL 10 MG PO TABS: 10.0000 mg | ORAL_TABLET | Freq: Three times a day (TID) | ORAL | 1 refills | Status: AC | PRN

## 2019-06-14 NOTE — Telephone Encounter (Signed)
Pt called stating she is having issues with lower back pain. It happens whether she is sitting or standing, She is also having some hip/ sciatic pain. Pt states she had issues woth muscle spasm with her last baby as well. Informed pt that I would talk with a provider and call her back.

## 2019-06-14 NOTE — Telephone Encounter (Signed)
Called pt to inform her of medication being sent in, and to try heat or ice as well as Tylenol as needed.

## 2019-06-29 ENCOUNTER — Ambulatory Visit (INDEPENDENT_AMBULATORY_CARE_PROVIDER_SITE_OTHER): Payer: Medicaid Other | Admitting: Obstetrics and Gynecology

## 2019-06-29 ENCOUNTER — Encounter: Payer: Self-pay | Admitting: Obstetrics and Gynecology

## 2019-06-29 ENCOUNTER — Other Ambulatory Visit (HOSPITAL_COMMUNITY)
Admission: RE | Admit: 2019-06-29 | Discharge: 2019-06-29 | Disposition: A | Discharge status: Home / Self Care | Payer: Medicaid Other | Source: Ambulatory Visit | Attending: Obstetrics and Gynecology | Admitting: Obstetrics and Gynecology

## 2019-06-29 ENCOUNTER — Other Ambulatory Visit: Payer: Self-pay

## 2019-06-29 VITALS — BP 130/75 | HR 81 | Ht 62.5 in | Wt 341.2 lb

## 2019-06-29 DIAGNOSIS — O23599 Infection of other part of genital tract in pregnancy, unspecified trimester: Secondary | ICD-10-CM | POA: Insufficient documentation

## 2019-06-29 DIAGNOSIS — A5901 Trichomonal vulvovaginitis: Secondary | ICD-10-CM | POA: Insufficient documentation

## 2019-06-29 DIAGNOSIS — Z30017 Encounter for initial prescription of implantable subdermal contraceptive: Secondary | ICD-10-CM | POA: Diagnosis not present

## 2019-06-29 DIAGNOSIS — Z98891 History of uterine scar from previous surgery: Secondary | ICD-10-CM

## 2019-06-29 MED ORDER — ETONOGESTREL 68 MG ~~LOC~~ IMPL
68.0000 mg | DRUG_IMPLANT | Freq: Once | SUBCUTANEOUS | Status: AC
  Administered 2019-06-29: 68 mg via SUBCUTANEOUS

## 2019-06-29 NOTE — Procedures (Signed)
Nexplanon Insertion Procedure Note Prior to the procedure being performed, the patient (or guardian) was asked to state their full name, date of birth, type of procedure being performed and the exact location of the operative site. This information was then checked against the documentation in the patient's chart. Prior to the procedure being performed, a "time out" was performed by the physician that confirmed the correct patient, procedure and site.  After informed consent was obtained, the patient's non-dominant left arm was chosen for insertion. A site was marked approximately 8 cm proximal to the medial epicondyle in the sulcus between the biceps and triceps on the inner surface. The area was cleaned with alcohol then local anesthesia was infiltrated with 3 ml of 1% lidocaine along the planned insertion track. The area was prepped with betadine. Using sterile technique the Nexplanon device was inserted per manufacturer's guidelines in the subdermal connective tissue using the standard insertion technique without difficulty. Pressure was applied and the insertion site was hemostatic. The presence of the Nexplanon was confirmed immediately after insertion by palpation by both me and the patient and by checking the tip of needle for the absence of the insert.  A pressure dressing was applied.   The patient tolerated the procedure well.  Codi Kertz, Jr MD [facility]ending [facility])  

## 2019-06-29 NOTE — Progress Notes (Signed)
Obstetrics and Gynecology Postpartum Visit  Appointment Date: 06/29/2019  OBGYN Clinic: [facility]  Primary Care Provider: Patient, No Pcp Per  Chief Complaint:  Chief Complaint  Patient presents with  . Postpartum Care    History of Present Illness: Kellie Boyd is a 23 y.o. African-American [id number] (Patient's last menstrual period was 08/23/2018.), seen for the above chief complaint. Her past medical history is significant for BMI 60s, h/o c-section, h/o trich   She is s/p VBAC/intact perineum on 7/17; she was discharged to home on PPD#1  Vaginal bleeding or discharge: No  Breast or formula feeding: formula Intercourse: No  Contraception after delivery: abstinence PP depression s/s: No  Any bowel or bladder issues: No  Pap smear: unknown  She was treated for trich during her delivery admission  Review of Systems:  as noted in the History of Present Illness.  Patient Active Problem List   Diagnosis Date Noted  . Alpha thalassemia silent carrier 03/23/2019  . 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 Anemia in pregnancy 03/15/2019  . Trichomonal vaginitis during pregnancy 03/15/2019  . Late prenatal care 03/11/2019  . Obesity in pregnancy 03/11/2019  . Supervision of high risk pregnancy, antepartum 03/11/2019  . BMI 60.0-69.9, adult ([place]) 03/11/2019  . History of cesarean delivery affecting pregnancy 03/11/2019  . History of VBAC 03/11/2019    Medications Kellie Boyd had no medications administered during this visit. Current Outpatient Medications  Medication Sig Dispense Refill  . cyclobenzaprine (FLEXERIL) 10 MG tablet Take 1 tablet (10 mg total) by mouth 3 (three) times daily as needed for muscle spasms. 30 tablet 1  . ibuprofen (ADVIL) 600 MG tablet Take 1 tablet (600 mg total) by mouth every 6 (six) hours. 30 tablet 0  . Prenatal Vit-Fe Fumarate-FA (PREPLUS) 27-1 MG TABS Take 1 tablet by mouth daily. 30 tablet 13    No current facility-administered medications for this visit.     Allergies Patient has no known allergies.  Physical Exam:  BP 130/75   Pulse 81   Ht 5' 2.5" (1.588 m)   Wt (!) 341 lb 3.2 oz (154.8 kg)   LMP 08/23/2018   BMI 61.41 kg/m  Body mass index is 61.41 kg/m. General appearance: Well nourished, well developed female in no acute distress.  Abdomen: obese, nttp Neuro/Psych:  Normal mood and affect.  Skin:  Warm and dry.  Lymphatic:  No inguinal lymphadenopathy.   Pelvic exam: is limited by body habitus EGBUS: within normal limits Vagina: +white, somewhat watery d/c in vault. no blood in the vault, Cervix:  Very anterior and difficult to fully see. no lesions or cervical motion tenderness No abdominal or pelvic masses  Laboratory: UPT negative  PP Depression Screening:  EPDS score zero  Assessment: pt doing well  Plan:  See procedure note for nexplanon insertion. D/w her to wait one week before considering effective.  Pap done today F/u std swab TOC  RTC 3 months for annual exam.   Durene Romans MD Attending [facility])

## 2019-06-30 LAB — CYTOLOGY - PAP
Adequacy: ABSENT
Diagnosis: NEGATIVE
HPV: NOT DETECTED

## 2019-07-01 LAB — CERVICOVAGINAL ANCILLARY ONLY
Bacterial vaginitis: POSITIVE — AB
Candida vaginitis: NEGATIVE
Chlamydia: NEGATIVE
Neisseria Gonorrhea: NEGATIVE
Trichomonas: POSITIVE — AB

## 2019-07-01 NOTE — Progress Notes (Signed)
added

## 2019-07-05 ENCOUNTER — Other Ambulatory Visit: Payer: Self-pay

## 2019-07-05 MED ORDER — METRONIDAZOLE 500 MG PO TABS: 500.0000 mg | ORAL_TABLET | Freq: Two times a day (BID) | ORAL | 0 refills | Status: DC

## 2019-07-05 NOTE — Addendum Note (Signed)
Addended by: Aletha Halim on: 07/05/2019 08:09 AM   Modules accepted: Orders

## 2019-07-05 NOTE — Telephone Encounter (Signed)
Patient has trich again and BV. Patient advised to take flagyl BID for 7 days. Partner needs to be treated again will ask provider if we can send in another dose for partner.

## 2019-07-08 ENCOUNTER — Other Ambulatory Visit: Payer: Self-pay

## 2019-07-08 MED ORDER — METRONIDAZOLE 500 MG PO TABS: 500.0000 mg | ORAL_TABLET | Freq: Two times a day (BID) | ORAL | 0 refills | Status: DC

## 2019-07-08 NOTE — Telephone Encounter (Signed)
Refill sent in for patient partner since she continues to be positive for trich.

## 2019-08-30 ENCOUNTER — Ambulatory Visit: Payer: Medicaid Other

## 2020-06-27 ENCOUNTER — Other Ambulatory Visit: Payer: Self-pay

## 2020-06-27 ENCOUNTER — Other Ambulatory Visit (HOSPITAL_COMMUNITY)
Admission: RE | Admit: 2020-06-27 | Discharge: 2020-06-27 | Disposition: A | Discharge status: Home / Self Care | Payer: Medicaid Other | Source: Ambulatory Visit | Attending: Obstetrics and Gynecology | Admitting: Obstetrics and Gynecology

## 2020-06-27 ENCOUNTER — Encounter: Payer: Self-pay | Admitting: Obstetrics and Gynecology

## 2020-06-27 ENCOUNTER — Ambulatory Visit (INDEPENDENT_AMBULATORY_CARE_PROVIDER_SITE_OTHER): Payer: Medicaid Other | Admitting: Obstetrics and Gynecology

## 2020-06-27 VITALS — BP 147/77 | HR 79 | Wt 349.0 lb

## 2020-06-27 DIAGNOSIS — Z975 Presence of (intrauterine) contraceptive device: Secondary | ICD-10-CM | POA: Diagnosis not present

## 2020-06-27 DIAGNOSIS — R030 Elevated blood-pressure reading, without diagnosis of hypertension: Secondary | ICD-10-CM

## 2020-06-27 DIAGNOSIS — O23599 Infection of other part of genital tract in pregnancy, unspecified trimester: Secondary | ICD-10-CM

## 2020-06-27 DIAGNOSIS — Z3046 Encounter for surveillance of implantable subdermal contraceptive: Secondary | ICD-10-CM

## 2020-06-27 DIAGNOSIS — A5901 Trichomonal vulvovaginitis: Secondary | ICD-10-CM

## 2020-06-27 DIAGNOSIS — Z8669 Personal history of other diseases of the nervous system and sense organs: Secondary | ICD-10-CM

## 2020-06-27 NOTE — Progress Notes (Signed)
Having symptoms with her nexplanon, she states she had prolonged spotting which got better then started getting  "dizzy headaches"

## 2020-06-27 NOTE — Progress Notes (Signed)
Obstetrics and Gynecology Visit Return Patient Evaluation  Appointment Date: 06/27/2020  Primary Care Provider: Patient, No Pcp Per  OBGYN Clinic: [facility]  Chief Complaint: headaches, aub with nexplanon in   History of Present Illness:  Kellie Boyd is a 24 y.o. with nexplanon placed 06/2019.   She states she had AUB with the nexplanon until late July/Early August and no bleeding since. Since that time, she's had two episodes of a frontal HA that last for about an hour. The first time it resolved with eating something and 2nd one was by itself. No h/o HAs  Review of Systems:  as noted in the History of Present Illness.  Patient Active Problem List   Diagnosis Date Noted  . Alpha thalassemia silent carrier 03/23/2019  . 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 Anemia in pregnancy 03/15/2019  . Trichomonal vaginitis during pregnancy 03/15/2019  . BMI 60.0-69.9, adult ([facility]) 03/11/2019   Medications:  Kellie Boyd had no medications administered during this visit. Current Outpatient Medications  Medication Sig Dispense Refill  . etonogestrel (NEXPLANON) 68 MG IMPL implant 1 each by Subdermal route once.    . cyclobenzaprine (FLEXERIL) 10 MG tablet Take 1 tablet (10 mg total) by mouth 3 (three) times daily as needed for muscle spasms. 30 tablet 1  . ibuprofen (ADVIL) 600 MG tablet Take 1 tablet (600 mg total) by mouth every 6 (six) hours. 30 tablet 0  . metroNIDAZOLE (FLAGYL) 500 MG tablet Take 1 tablet (500 mg total) by mouth 2 (two) times daily. 14 tablet 0  . Prenatal Vit-Fe Fumarate-FA (PREPLUS) 27-1 MG TABS Take 1 tablet by mouth daily. 30 tablet 13   No current facility-administered medications for this visit.    Allergies: has No Known Allergies.  Physical Exam:  BP (!) 147/77   Pulse 79   Wt (!) 349 lb (158.3 kg)   BMI 62.82 kg/m  Body mass index is 62.82 kg/m. General appearance: Well nourished, well developed female in  no acute distress. +acanthosis nigricans  Neuro/Psych:  Normal mood and affect.  Normal gait. Normal gross motor function  Assessment: pt stable  Plan:  1. Trichomonal vaginitis during pregnancy, antepartum toc of cure today. +last august. Pt decline serum testing - Cervicovaginal ancillary only( [place])  2. Nexplanon in place I told her to let us know if the AUB comes back. Follow up self swab results. I told her that I don't believe the HAs are related to the nexplanon since it started so recently. I told her that we need to work on getting her healthy and weight loss. She recently got info from [facility] as to her PCP and I told her to call ASAP for an appointment as the HA could be related to potentially diabetes, HTN, etc. I told her if the s/s get more frequent to let us know. I told her to try and leave the nexplanon in place for now, especially since her contraception options are very limited at the moment.   RTC: PRN  Cornelia Copa MD Attending Center for [facility])

## 2020-06-28 LAB — CERVICOVAGINAL ANCILLARY ONLY
Bacterial Vaginitis (gardnerella): POSITIVE — AB
Candida Glabrata: NEGATIVE
Candida Vaginitis: NEGATIVE
Chlamydia: NEGATIVE
Comment: NEGATIVE
Comment: NEGATIVE
Comment: NEGATIVE
Comment: NEGATIVE
Comment: NEGATIVE
Comment: NORMAL
Neisseria Gonorrhea: NEGATIVE
Trichomonas: NEGATIVE

## 2020-07-03 MED ORDER — METRONIDAZOLE 500 MG PO TABS: 500.0000 mg | ORAL_TABLET | Freq: Two times a day (BID) | ORAL | 0 refills | Status: AC

## 2020-07-03 NOTE — Addendum Note (Signed)
Addended by: [employer] on: 07/03/2020 12:22 PM   Modules accepted: Orders

## 2020-07-17 ENCOUNTER — Encounter: Payer: Self-pay | Admitting: Radiology
